# Patient Record
Sex: Male | Born: 1985
Health system: Southern US, Community
[De-identification: ages and names within clinical notes are randomized; demographics above are authoritative.]

## PROBLEM LIST (undated history)

## (undated) DIAGNOSIS — K644 Residual hemorrhoidal skin tags: Secondary | ICD-10-CM

## (undated) DIAGNOSIS — F17290 Nicotine dependence, other tobacco product, uncomplicated: Secondary | ICD-10-CM

## (undated) DIAGNOSIS — F419 Anxiety disorder, unspecified: Secondary | ICD-10-CM

## (undated) DIAGNOSIS — G4730 Sleep apnea, unspecified: Secondary | ICD-10-CM

## (undated) DIAGNOSIS — T4145XA Adverse effect of unspecified anesthetic, initial encounter: Secondary | ICD-10-CM

## (undated) DIAGNOSIS — K648 Other hemorrhoids: Secondary | ICD-10-CM

## (undated) DIAGNOSIS — T8859XA Other complications of anesthesia, initial encounter: Secondary | ICD-10-CM

## (undated) DIAGNOSIS — J301 Allergic rhinitis due to pollen: Secondary | ICD-10-CM

## (undated) HISTORY — DX: Allergic rhinitis due to pollen: J30.1

## (undated) HISTORY — DX: Other hemorrhoids: K64.8

## (undated) HISTORY — PX: WISDOM TOOTH EXTRACTION: SHX21

## (undated) HISTORY — DX: Sleep apnea, unspecified: G47.30

## (undated) HISTORY — DX: Anxiety disorder, unspecified: F41.9

## (undated) HISTORY — DX: Residual hemorrhoidal skin tags: K64.4

## (undated) HISTORY — DX: Nicotine dependence, other tobacco product, uncomplicated: F17.290

---
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
## 2011-12-14 ENCOUNTER — Ambulatory Visit: Payer: Self-pay | Admitting: Family Medicine

## 2013-07-18 ENCOUNTER — Telehealth: Payer: Self-pay | Admitting: Family Medicine

## 2013-07-18 NOTE — Telephone Encounter (Signed)
i am happy to see him, I know his wife and family

## 2013-07-18 NOTE — Telephone Encounter (Signed)
Pt scheduled for 07/23/2013

## 2013-07-18 NOTE — Telephone Encounter (Signed)
Pt is wanting to est w/you as a PCP and his wife says he is overall healthy other than some knee pain.  His wife, Tyler Simon is your patient.  I explained you are not currently accepting new patients and offered to est him w/another physician.  Mrs. Barajas asked if I could see if you could agree to see him since she is your patient. Would you be willing to accept him as a new patient? Thank you.

## 2013-07-23 ENCOUNTER — Encounter: Payer: Self-pay | Admitting: Family Medicine

## 2013-07-23 ENCOUNTER — Ambulatory Visit (INDEPENDENT_AMBULATORY_CARE_PROVIDER_SITE_OTHER): Payer: BC Managed Care – PPO | Admitting: Family Medicine

## 2013-07-23 VITALS — BP 100/66 | HR 50 | Temp 98.7°F | Ht 69.0 in | Wt 143.0 lb

## 2013-07-23 DIAGNOSIS — M25569 Pain in unspecified knee: Secondary | ICD-10-CM

## 2013-07-23 DIAGNOSIS — IMO0002 Reserved for concepts with insufficient information to code with codable children: Secondary | ICD-10-CM

## 2013-07-23 DIAGNOSIS — M222X1 Patellofemoral disorders, right knee: Secondary | ICD-10-CM

## 2013-07-23 DIAGNOSIS — F17200 Nicotine dependence, unspecified, uncomplicated: Secondary | ICD-10-CM

## 2013-07-23 DIAGNOSIS — F17290 Nicotine dependence, other tobacco product, uncomplicated: Secondary | ICD-10-CM

## 2013-07-23 DIAGNOSIS — L723 Sebaceous cyst: Secondary | ICD-10-CM

## 2013-07-23 DIAGNOSIS — R21 Rash and other nonspecific skin eruption: Secondary | ICD-10-CM

## 2013-07-23 DIAGNOSIS — J301 Allergic rhinitis due to pollen: Secondary | ICD-10-CM

## 2013-07-23 MED ORDER — TRIAMCINOLONE ACETONIDE 0.1 % EX CREA: 1.0000 "application " | TOPICAL_CREAM | Freq: Two times a day (BID) | CUTANEOUS | Status: DC

## 2013-07-23 NOTE — Patient Instructions (Addendum)
Alleve 2 tabs by mouth two times a day over the counter: Take at least for 2 - 3 weeks. This is equal to a prescripton strength dose (GENERIC CHEAPER EQUIVALENT IS NAPROXEN SODIUM)   Allegra or Zyrtec - once day

## 2013-07-23 NOTE — Progress Notes (Signed)
Date:  07/23/2013   Name:  Tyler Simon   DOB:  April 26, 1986   MRN:  [id number] Gender: male Age: 27 y.o.  Primary Physician:  Hannah Beat, MD   Chief Complaint: New Patient   Subjective:   History of Present Illness:  Tyler Simon is a 27 y.o. pleasant patient who presents with the following:  New patient: from [place].   Came over here to get married. 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 Works for [employer]  Allergies, some benadryl. Helping him a little bit.   Also, knee has ben bothering him a lot. Works on Public affairs consultant and back right. No history of prior injuury or problem. No buckling, no locking up.  r inguinal canal - lower, has something that is hard and palpable, tender and it causes him some pain intermittently and with activity.  He also has had a scattered rash  Patient Active Problem List   Diagnosis Date Noted  . Allergic rhinitis due to pollen 07/25/2013  . Pipe smoker 07/25/2013    Past Medical History  Diagnosis Date  . Allergic rhinitis due to pollen 07/25/2013  . Pipe smoker 07/25/2013    Past Surgical History  Procedure Laterality Date  . Appendectomy  1988    History   Social History  . Marital Status: Married    Spouse Name: Bo Merino    Number of Children: 0  . Years of Education: N/A   Occupational History  . Engineer     [employer]   Social History Main Topics  . Smoking status: Current Every Day Smoker    Types: Pipe  . Smokeless tobacco: Never Used  . Alcohol Use: Yes  . Drug Use: No  . Sexual Activity: Yes    Partners: Female   Other Topics Concern  . Not on file   Social History Narrative   Married to patient Production manager at [place]   From [place], [place]    No family history on file.  No Known Allergies  Medication list reviewed and updated in full in [facility].   Review of Systems:  GEN: No acute illnesses, no fevers, chills. GI: No n/v/d, eating normally Pulm: No SOB Interactive and  getting along well at home.  Otherwise, ROS is as per the HPI.   Objective:   Physical Examination: BP 100/66  Pulse 50  Temp(Src) 98.7 F (37.1 C) (Oral)  Ht 5\' 9"  (1.753 m)  Wt 143 lb (64.864 kg)  BMI 21.11 kg/m2  Ideal Body Weight: Weight in (lb) to have BMI = 25: 168.9   GEN: WDWN, NAD, Non-toxic, A & O x 3 HEENT: Atraumatic, Normocephalic. Neck supple. No masses, No LAD. Ears and Nose: No external deformity. CV: RRR, No M/G/R. No JVD. No thrill. No extra heart sounds. PULM: CTA B, no wheezes, crackles, rhonchi. No retractions. No resp. distress. No accessory muscle use. EXTR: No c/c/e NEURO Normal gait.  PSYCH: Normally interactive. Conversant. Not depressed or anxious appearing.  Calm demeanor.   GU: R inguinal region with palpable knot / moveable but painful in the inguinal ring but lower approaching genitals  Knee:  r Gait: Normal heel toe pattern ROM: 0-135 Effusion: neg Echymosis or edema: none Patellar tendon NT Painful PLICA: neg Patellar grind: negative Medial and lateral patellar facet loading: mildly tender medial and lateral joint lines:NT Mcmurray's neg Flexion-pinch neg Varus and valgus stress: stable Lachman: neg Ant and Post drawer: neg Hip abduction, IR, ER: WNL Hip flexion str: 5/5 Hip  abd: 5/5 Quad: 5/5 VMO atrophy:No Hamstring concentric and eccentric: 5/5   Assessment & Plan:   Rash and nonspecific skin eruption  Allergic rhinitis due to pollen  Pipe smoker  Patellofemoral pain syndrome, right  Inguinal cyst   Tac for rash Allergy meds  Reviewed basic knee care and rehab  Cyst / painful R inguinal canal, doubt that it is a hernia? Clearly painful and palpable and has grown. We will consult general surgery for their opinion in this case. Appears out of the scrotum.  Patient Instructions  Alleve 2 tabs by mouth two times a day over the counter: Take at least for 2 - 3 weeks. This is equal to a prescripton strength  dose (GENERIC CHEAPER EQUIVALENT IS NAPROXEN SODIUM)   Allegra or Zyrtec - once day   Orders Today:  No orders of the defined types were placed in this encounter.    New medications, updates to list, dose adjustments: Meds ordered this encounter  Medications  . diphenhydrAMINE (BENADRYL) 25 mg capsule    Sig: Take 25 mg by mouth at bedtime as needed.  . triamcinolone cream (KENALOG) 0.1 %    Sig: Apply 1 application topically 2 (two) times daily.    Dispense:  454 g    Refill:  1    Signed,  Monda Chastain T. Daren Yeagle, MD, CAQ Sports Medicine  [facility] at [facility] [address] [place] [address] Phone: [phone] Fax: [phone]  Updated Complete Medication List:   Medication List       This list is accurate as of: 07/23/13 11:59 PM.  Always use your most recent med list.               diphenhydrAMINE 25 mg capsule  Commonly known as:  BENADRYL  Take 25 mg by mouth at bedtime as needed.     triamcinolone cream 0.1 %  Commonly known as:  KENALOG  Apply 1 application topically 2 (two) times daily.

## 2013-07-23 NOTE — Progress Notes (Signed)
Pre-visit discussion using our clinic review tool. No additional management support is needed unless otherwise documented below in the visit note.  

## 2013-07-25 ENCOUNTER — Encounter: Payer: Self-pay | Admitting: Family Medicine

## 2013-07-25 DIAGNOSIS — F17290 Nicotine dependence, other tobacco product, uncomplicated: Secondary | ICD-10-CM

## 2013-07-25 DIAGNOSIS — J301 Allergic rhinitis due to pollen: Secondary | ICD-10-CM

## 2013-07-25 HISTORY — DX: Allergic rhinitis due to pollen: J30.1

## 2013-07-25 HISTORY — DX: Nicotine dependence, other tobacco product, uncomplicated: F17.290

## 2013-08-12 ENCOUNTER — Ambulatory Visit (INDEPENDENT_AMBULATORY_CARE_PROVIDER_SITE_OTHER): Payer: BC Managed Care – PPO | Admitting: Family Medicine

## 2013-08-12 ENCOUNTER — Encounter: Payer: Self-pay | Admitting: Family Medicine

## 2013-08-12 ENCOUNTER — Ambulatory Visit (INDEPENDENT_AMBULATORY_CARE_PROVIDER_SITE_OTHER)
Admission: RE | Admit: 2013-08-12 | Discharge: 2013-08-12 | Disposition: A | Discharge status: Home / Self Care | Payer: BC Managed Care – PPO | Source: Ambulatory Visit | Attending: Family Medicine | Admitting: Family Medicine

## 2013-08-12 VITALS — BP 110/60 | HR 52 | Temp 98.4°F | Ht 69.0 in | Wt 146.5 lb

## 2013-08-12 DIAGNOSIS — M25561 Pain in right knee: Secondary | ICD-10-CM

## 2013-08-12 DIAGNOSIS — M25569 Pain in unspecified knee: Secondary | ICD-10-CM

## 2013-08-12 NOTE — Progress Notes (Signed)
Date:  08/12/2013   Name:  Tyler Simon   DOB:  [birth year]   MRN:  [id number] Gender: male Age: 27 y.o.  Primary Physician:  Hannah Beat, MD   Chief Complaint: Knee Pain   Subjective:   History of Present Illness:  Stein Kurtz is a 27 y.o. pleasant patient who presents with the following:  Several months ago the patient was playing kickball, in a routine kickball league, and at some point during that, he developed some posterior knee pain. It has persisted since that time. It is actually been worsening in the last few weeks. I briefly look at his knee several weeks ago when he was in the office for something else. He has tried multiple conservative treatments including anti-inflammatory medications, ice, Tylenol, and some rehabilitation. This is been ongoing now for more than a month.  Right knee. No injury. Thought maybe part from kickball, but he stopped playing for a few weeks. Some pain with walking. Hurts a lot laterally and with deep flexion. He has not had any real mechanical locking up of his knee. He does feel as if it is in a hasn't symptomatic giving way. He does feel like he has had some weakness, particularly in the posterior aspect.  R posterior lateral pain, + mcmurrays, bounce home.   Patient Active Problem List   Diagnosis Date Noted  . Allergic rhinitis due to pollen 07/25/2013  . Pipe smoker 07/25/2013    Past Medical History  Diagnosis Date  . Allergic rhinitis due to pollen 07/25/2013  . Pipe smoker 07/25/2013    Past Surgical History  Procedure Laterality Date  . Appendectomy  1988    History   Social History  . Marital Status: Married    Spouse Name: Bo Merino    Number of Children: 0  . Years of Education: N/A   Occupational History  . Engineer     [employer]   Social History Main Topics  . Smoking status: Current Every Day Smoker    Types: Pipe  . Smokeless tobacco: Never Used  . Alcohol Use: Yes  . Drug Use: No  . Sexual Activity:  Yes    Partners: Female   Other Topics Concern  . Not on file   Social History Narrative   Married to patient Production manager at [place]   From [place], [place]    No family history on file.  No Known Allergies  Medication list has been reviewed and updated.  Review of Systems:  GEN: No fevers, chills. Nontoxic. Primarily MSK c/o today. MSK: Detailed in the HPI GI: tolerating PO intake without difficulty Neuro: No numbness, parasthesias, or tingling associated. Otherwise the pertinent positives of the ROS are noted above.   Objective:   Physical Examination: BP 110/60  Pulse 52  Temp(Src) 98.4 F (36.9 C) (Oral)  Ht 5\' 9"  (1.753 m)  Wt 146 lb 8 oz (66.452 kg)  BMI 21.62 kg/m2  Ideal Body Weight: Weight in (lb) to have BMI = 25: 168.9   GEN: WDWN, NAD, Non-toxic, Alert & Oriented x 3 HEENT: Atraumatic, Normocephalic.  Ears and Nose: No external deformity. EXTR: No clubbing/cyanosis/edema NEURO: Normal gait.  PSYCH: Normally interactive. Conversant. Not depressed or anxious appearing.  Calm demeanor.   Knee:  R Gait: Normal heel toe pattern ROM: 0-125 Effusion: neg Echymosis or edema: none Patellar tendon NT Painful PLICA: neg Patellar grind: negative Medial and lateral patellar facet loading: negative medial and lateral joint lines: ttp laterally and posterior  Mcmurray's + for pain Flexion-pinch + Bounce home + Varus and valgus stress: stable Lachman: neg Ant and Post drawer: neg Hip abduction, IR, ER: WNL Hip flexion str: 5/5 Hip abd: 5/5 Quad: 5/5 VMO atrophy:No Hamstring concentric and eccentric: 4/5 at 90 deg  Dg Knee Ap/lat W/sunrise Right  08/12/2013   CLINICAL DATA:  Pain  EXAM: DG KNEE - 3 VIEWS  COMPARISON:  None.  FINDINGS: There is no evidence of fracture, dislocation, or joint effusion. There is no evidence of arthropathy or other focal bone abnormality. Soft tissues are unremarkable.  IMPRESSION: Negative.   Electronically Signed    By: Oley Balm M.D.   On: 08/12/2013 11:11    Assessment & Plan:    Right knee pain - Plan: DG Knee AP/LAT W/Sunrise Right, MR Knee Right Wo Contrast  With a positive bounce home test, pain on the lateral joint line, and pain with McMurray's evaluation, this is suggestive of a lateral meniscal tear. The patient also has pain at the insertion of the biceps femoris tendon, and a posterior lateral corner injury cannot be excluded. Obtain an MRI of the RIGHT knee from to evaluate for potential meniscal tear or posterior lateral corner injury. This will dictate the patient's plan of care.   Patient Instructions  REFERRAL: GO THE THE FRONT ROOM AT THE ENTRANCE OF OUR CLINIC, NEAR CHECK IN. ASK FOR MARION. SHE WILL HELP YOU SET UP YOUR REFERRAL. DATE: TIME:    Orders Today:  Orders Placed This Encounter  Procedures  . DG Knee AP/LAT W/Sunrise Right  . MR Knee Right Wo Contrast    New medications, updates to list, dose adjustments: No orders of the defined types were placed in this encounter.    Signed,  Elpidio Galea. Maymie Brunke, MD, CAQ Sports Medicine  [facility] at [facility] [address] [place] [address] Phone: [phone] Fax: [phone]  Updated Complete Medication List:   Medication List    Notice As of 08/12/2013  7:56 PM   You have not been prescribed any medications.

## 2013-08-12 NOTE — Patient Instructions (Signed)
REFERRAL: GO THE THE FRONT ROOM AT THE ENTRANCE OF OUR CLINIC, NEAR CHECK IN. ASK FOR MARION. SHE WILL HELP YOU SET UP YOUR REFERRAL. DATE: TIME:  

## 2013-08-12 NOTE — Progress Notes (Signed)
Pre-visit discussion using our clinic review tool. No additional management support is needed unless otherwise documented below in the visit note.  

## 2013-08-19 ENCOUNTER — Other Ambulatory Visit: Payer: BC Managed Care – PPO

## 2013-08-25 ENCOUNTER — Ambulatory Visit (INDEPENDENT_AMBULATORY_CARE_PROVIDER_SITE_OTHER): Payer: BC Managed Care – PPO | Admitting: General Surgery

## 2013-08-29 ENCOUNTER — Encounter (INDEPENDENT_AMBULATORY_CARE_PROVIDER_SITE_OTHER): Payer: Self-pay | Admitting: Surgery

## 2013-08-29 ENCOUNTER — Encounter (INDEPENDENT_AMBULATORY_CARE_PROVIDER_SITE_OTHER): Payer: Self-pay

## 2013-08-29 ENCOUNTER — Ambulatory Visit (INDEPENDENT_AMBULATORY_CARE_PROVIDER_SITE_OTHER): Payer: BC Managed Care – PPO | Admitting: Surgery

## 2013-08-29 VITALS — BP 126/76 | HR 60 | Temp 99.0°F | Resp 18 | Ht 69.0 in | Wt 143.0 lb

## 2013-08-29 DIAGNOSIS — R590 Localized enlarged lymph nodes: Secondary | ICD-10-CM | POA: Insufficient documentation

## 2013-08-29 DIAGNOSIS — R599 Enlarged lymph nodes, unspecified: Secondary | ICD-10-CM

## 2013-08-29 NOTE — Progress Notes (Signed)
Patient ID: Tyler Simon, male   DOB: 1986-04-10, 27 y.o.   MRN: [id number]  Chief Complaint  Patient presents with  . New Evaluation    cyst  rt groin     HPI Tyler Simon is a 27 y.o. male.  Referred by Dr. 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 He has had ultrasounds of this area when he was still living in [place]. He feels like it may be slightly larger. He is disturbed by the fact that it persists. He comes in today for surgical evaluation for excisional biopsy.    He has had some issues with pain in his right hip as well as his right knee.  Past Medical History  Diagnosis Date  . Allergic rhinitis due to pollen 07/25/2013  . Pipe smoker 07/25/2013    Past Surgical History  Procedure Laterality Date  . Appendectomy  1988    Family History  Problem Relation Age of Onset  . Alzheimer's disease Father     Social History History  Substance Use Topics  . Smoking status: Current Every Day Smoker    Types: Pipe  . Smokeless tobacco: Never Used  . Alcohol Use: Yes  From [place], [place]  No Known Allergies  No current outpatient prescriptions on file.   No current facility-administered medications for this visit.    Review of Systems Review of Systems  Constitutional: Negative for fever, chills and unexpected weight change.  HENT: Negative for congestion, hearing loss, sore throat, trouble swallowing and voice change.   Eyes: Negative for visual disturbance.  Respiratory: Negative for cough and wheezing.   Cardiovascular: Negative for chest pain, palpitations and leg swelling.  Gastrointestinal: Negative for nausea, vomiting, abdominal pain, diarrhea, constipation, blood in stool, abdominal distention, anal bleeding and rectal pain.  Genitourinary: Negative for hematuria and difficulty urinating.  Musculoskeletal: Negative for arthralgias.   Skin: Negative for rash and wound.  Neurological: Negative for seizures, syncope, weakness and headaches.  Hematological: Negative for adenopathy. Does not bruise/bleed easily.  Psychiatric/Behavioral: Negative for confusion.    Blood pressure 126/76, pulse 60, temperature 99 F (37.2 C), resp. rate 18, height 5\' 9"  (1.753 m), weight 143 lb (64.864 kg).  Physical Exam Physical Exam WDWN in NAD HEENT:  EOMI, sclera anicteric Neck:  No masses, no thyromegaly Lungs:  CTA bilaterally; normal respiratory effort CV:  Regular rate and rhythm; no murmurs Abd:  +bowel sounds, soft, non-tender, no masses GU:  Right groin near pubic tubercle - 1.5 cm subcutaneous mass; not protruding; Ext:  Well-perfused; no edema Skin:  Warm, dry; no sign of jaundice  Data Reviewed none  Assessment    Right inguinal lymphadenopathy; this feels deeper than a typical sebaceous cyst.     Plan    Recommend excisional lymph node biopsy for diagnosis.  The surgical procedure has been discussed with the patient.  Potential risks, benefits, alternative treatments, and expected outcomes have been explained.  All of the patient's questions at this time have been answered.  The likelihood of reaching the patient's treatment goal is good.  The patient understand the proposed surgical procedure and wishes to proceed.  08/29/2013 11:57 AM        Tyffani Foglesong K. 08/29/2013, 11:49 AM

## 2013-09-26 ENCOUNTER — Encounter (HOSPITAL_COMMUNITY): Payer: Self-pay | Admitting: Pharmacy Technician

## 2013-10-02 ENCOUNTER — Encounter (HOSPITAL_COMMUNITY): Payer: Self-pay

## 2013-10-02 ENCOUNTER — Encounter (HOSPITAL_COMMUNITY)
Admission: RE | Admit: 2013-10-02 | Discharge: 2013-10-02 | Disposition: A | Discharge status: Home / Self Care | Payer: BC Managed Care – PPO | Source: Ambulatory Visit | Attending: Surgery | Admitting: Surgery

## 2013-10-02 DIAGNOSIS — Z01812 Encounter for preprocedural laboratory examination: Secondary | ICD-10-CM | POA: Insufficient documentation

## 2013-10-02 DIAGNOSIS — Z01818 Encounter for other preprocedural examination: Secondary | ICD-10-CM | POA: Insufficient documentation

## 2013-10-02 HISTORY — DX: Other complications of anesthesia, initial encounter: T88.59XA

## 2013-10-02 HISTORY — DX: Adverse effect of unspecified anesthetic, initial encounter: T41.45XA

## 2013-10-02 LAB — BASIC METABOLIC PANEL
BUN: 19 mg/dL (ref 6–23)
CO2: 29 meq/L (ref 19–32)
Calcium: 9.5 mg/dL (ref 8.4–10.5)
Chloride: 103 mEq/L (ref 96–112)
Creatinine, Ser: 1.03 mg/dL (ref 0.50–1.35)
GFR calc Af Amer: >90 mL/min (ref >90)
GFR calc non Af Amer: >90 mL/min (ref >90)
Glucose, Bld: 136 mg/dL — ABNORMAL HIGH (ref 70–99)
Potassium: 4.7 mEq/L (ref 3.7–5.3)
Sodium: 142 mEq/L (ref 137–147)

## 2013-10-02 LAB — CBC
HCT: 44.9 % (ref 39.0–52.0)
Hemoglobin: 15.7 g/dL (ref 13.0–17.0)
MCH: 31.2 pg (ref 26.0–34.0)
MCHC: 35 g/dL (ref 30.0–36.0)
MCV: 89.1 fL (ref 78.0–100.0)
PLATELETS: 197 10*3/uL (ref 150–400)
RBC: 5.04 MIL/uL (ref 4.22–5.81)
RDW: 12.8 % (ref 11.5–15.5)
WBC: 6.5 10*3/uL (ref 4.0–10.5)

## 2013-10-02 NOTE — Pre-Procedure Instructions (Signed)
Tyler Simon  10/02/2013   Your procedure is scheduled on:  Thursday October 09, 2013 at 0931 AM  Report to [facility] Short Stay Main Entrance "A" at [phone] AM.  Call this number if you have problems the morning of surgery: [phone]   Remember:   Do not eat food or drink liquids after midnight.   Take these medicines the morning of surgery with A SIP OF WATER: None Stop all vitamins, Herbal medications, and Nsaids (Ibuprofen, Advil, Aleve and Naproxen) 5 days prior to surgery.  Do not wear jewelry.  Do not wear lotions,or powders. You may wear deodorant.             Men may shave face and neck.  Do not bring valuables to the hospital.  [facility] is not responsible for any belongings or valuables.               Contacts, dentures or bridgework may not be worn into surgery.  Leave suitcase in the car. After surgery it may be brought to your room.  For patients admitted to the hospital, discharge time is determined by your treatment team.               Patients discharged the day of surgery will not be allowed to drive home.  Name and phone number of your driver:   Special Instructions: Shower using CHG 2 nights before surgery and the night before surgery.  If you shower the day of surgery use CHG.  Use special wash - you have one bottle of CHG for all showers.  You should use approximately 1/3 of the bottle for each shower.   Please read over the following fact sheets that you were given: Pain Booklet, Coughing and Deep Breathing and Surgical Site Infection Prevention

## 2013-10-08 MED ORDER — CHLORHEXIDINE GLUCONATE 4 % EX LIQD: 1.0000 "application " | Freq: Once | CUTANEOUS | Status: DC

## 2013-10-08 MED ORDER — CEFAZOLIN SODIUM-DEXTROSE 2-3 GM-% IV SOLR
2.0000 g | INTRAVENOUS | Status: AC
  Administered 2013-10-09: 2 g via INTRAVENOUS
  Filled 2013-10-08: qty 50

## 2013-10-09 ENCOUNTER — Ambulatory Visit (HOSPITAL_COMMUNITY)
Admission: RE | Admit: 2013-10-09 | Discharge: 2013-10-09 | Disposition: A | Discharge status: Home / Self Care | Payer: BC Managed Care – PPO | Source: Ambulatory Visit | Attending: Surgery | Admitting: Surgery

## 2013-10-09 ENCOUNTER — Encounter (HOSPITAL_COMMUNITY)
Admission: RE | Disposition: A | Discharge status: Home / Self Care | Payer: Self-pay | Source: Ambulatory Visit | Attending: Surgery

## 2013-10-09 ENCOUNTER — Ambulatory Visit (HOSPITAL_COMMUNITY): Payer: BC Managed Care – PPO | Admitting: Anesthesiology

## 2013-10-09 ENCOUNTER — Encounter (HOSPITAL_COMMUNITY): Payer: BC Managed Care – PPO | Admitting: Anesthesiology

## 2013-10-09 ENCOUNTER — Encounter (HOSPITAL_COMMUNITY): Payer: Self-pay | Admitting: *Deleted

## 2013-10-09 DIAGNOSIS — F17200 Nicotine dependence, unspecified, uncomplicated: Secondary | ICD-10-CM | POA: Insufficient documentation

## 2013-10-09 DIAGNOSIS — J301 Allergic rhinitis due to pollen: Secondary | ICD-10-CM | POA: Insufficient documentation

## 2013-10-09 DIAGNOSIS — R599 Enlarged lymph nodes, unspecified: Secondary | ICD-10-CM | POA: Insufficient documentation

## 2013-10-09 DIAGNOSIS — IMO0002 Reserved for concepts with insufficient information to code with codable children: Secondary | ICD-10-CM

## 2013-10-09 DIAGNOSIS — N508 Other specified disorders of male genital organs: Secondary | ICD-10-CM | POA: Insufficient documentation

## 2013-10-09 DIAGNOSIS — N5089 Other specified disorders of the male genital organs: Secondary | ICD-10-CM

## 2013-10-09 HISTORY — PX: INGUINAL HERNIA REPAIR: SHX194

## 2013-10-09 SURGERY — REPAIR, HERNIA, INGUINAL, ADULT
Anesthesia: General | Site: Groin | Laterality: Right

## 2013-10-09 MED ORDER — LIDOCAINE HCL (CARDIAC) 20 MG/ML IV SOLN
INTRAVENOUS | Status: AC
  Filled 2013-10-09: qty 5

## 2013-10-09 MED ORDER — MORPHINE SULFATE 2 MG/ML IJ SOLN: 2.0000 mg | INTRAMUSCULAR | Status: DC | PRN

## 2013-10-09 MED ORDER — MIDAZOLAM HCL 2 MG/2ML IJ SOLN
INTRAMUSCULAR | Status: AC
  Filled 2013-10-09: qty 2

## 2013-10-09 MED ORDER — ONDANSETRON HCL 4 MG/2ML IJ SOLN
INTRAMUSCULAR | Status: AC
  Filled 2013-10-09: qty 2

## 2013-10-09 MED ORDER — DEXAMETHASONE SODIUM PHOSPHATE 10 MG/ML IJ SOLN
INTRAMUSCULAR | Status: DC | PRN
  Administered 2013-10-09: 8 mg via INTRAVENOUS

## 2013-10-09 MED ORDER — ONDANSETRON HCL 4 MG/2ML IJ SOLN
INTRAMUSCULAR | Status: DC | PRN
  Administered 2013-10-09: 4 mg via INTRAVENOUS

## 2013-10-09 MED ORDER — LACTATED RINGERS IV SOLN
INTRAVENOUS | Status: DC | PRN
  Administered 2013-10-09: 09:00:00 via INTRAVENOUS

## 2013-10-09 MED ORDER — HYDROMORPHONE HCL PF 1 MG/ML IJ SOLN
0.2500 mg | INTRAMUSCULAR | Status: DC | PRN
  Administered 2013-10-09 (×2): 0.5 mg via INTRAVENOUS

## 2013-10-09 MED ORDER — HYDROCODONE-ACETAMINOPHEN 5-325 MG PO TABS: 1.0000 | ORAL_TABLET | ORAL | Status: DC | PRN

## 2013-10-09 MED ORDER — PROPOFOL 10 MG/ML IV BOLUS
INTRAVENOUS | Status: DC | PRN
  Administered 2013-10-09: 200 mg via INTRAVENOUS

## 2013-10-09 MED ORDER — HYDROMORPHONE HCL PF 1 MG/ML IJ SOLN
INTRAMUSCULAR | Status: AC
  Filled 2013-10-09: qty 1

## 2013-10-09 MED ORDER — LACTATED RINGERS IV SOLN
INTRAVENOUS | Status: DC
  Administered 2013-10-09: 08:00:00 via INTRAVENOUS

## 2013-10-09 MED ORDER — DEXAMETHASONE SODIUM PHOSPHATE 4 MG/ML IJ SOLN
INTRAMUSCULAR | Status: AC
  Filled 2013-10-09: qty 2

## 2013-10-09 MED ORDER — LIDOCAINE HCL (CARDIAC) 20 MG/ML IV SOLN
INTRAVENOUS | Status: DC | PRN
  Administered 2013-10-09: 80 mg via INTRAVENOUS

## 2013-10-09 MED ORDER — MIDAZOLAM HCL 5 MG/5ML IJ SOLN
INTRAMUSCULAR | Status: DC | PRN
  Administered 2013-10-09: 2 mg via INTRAVENOUS

## 2013-10-09 MED ORDER — BUPIVACAINE-EPINEPHRINE 0.25% -1:200000 IJ SOLN
INTRAMUSCULAR | Status: DC | PRN
  Administered 2013-10-09: 5 mL

## 2013-10-09 MED ORDER — ONDANSETRON HCL 4 MG/2ML IJ SOLN: 4.0000 mg | INTRAMUSCULAR | Status: DC | PRN

## 2013-10-09 MED ORDER — FENTANYL CITRATE 0.05 MG/ML IJ SOLN
INTRAMUSCULAR | Status: AC
  Filled 2013-10-09: qty 5

## 2013-10-09 MED ORDER — ONDANSETRON HCL 4 MG/2ML IJ SOLN: 4.0000 mg | Freq: Once | INTRAMUSCULAR | Status: DC | PRN

## 2013-10-09 MED ORDER — FENTANYL CITRATE 0.05 MG/ML IJ SOLN
INTRAMUSCULAR | Status: DC | PRN
  Administered 2013-10-09: 50 ug via INTRAVENOUS
  Administered 2013-10-09: 25 ug via INTRAVENOUS
  Administered 2013-10-09: 100 ug via INTRAVENOUS

## 2013-10-09 MED ORDER — PROPOFOL 10 MG/ML IV BOLUS
INTRAVENOUS | Status: AC
  Filled 2013-10-09: qty 20

## 2013-10-09 SURGICAL SUPPLY — 49 items
BENZOIN TINCTURE PRP APPL 2/3 (GAUZE/BANDAGES/DRESSINGS) ×2 IMPLANT
BLADE SURG 15 STRL LF DISP TIS (BLADE) ×1 IMPLANT
BLADE SURG 15 STRL SS (BLADE) ×1
BLADE SURG ROTATE 9660 (MISCELLANEOUS) IMPLANT
CHLORAPREP W/TINT 26ML (MISCELLANEOUS) ×2 IMPLANT
COVER SURGICAL LIGHT HANDLE (MISCELLANEOUS) ×2 IMPLANT
DECANTER SPIKE VIAL GLASS SM (MISCELLANEOUS) IMPLANT
DRAIN PENROSE 1/2X12 LTX STRL (WOUND CARE) IMPLANT
DRAPE LAPAROSCOPIC ABDOMINAL (DRAPES) IMPLANT
DRAPE LAPAROTOMY TRNSV 102X78 (DRAPE) ×2 IMPLANT
DRAPE UTILITY 15X26 W/TAPE STR (DRAPE) ×4 IMPLANT
DRSG TEGADERM 2-3/8X2-3/4 SM (GAUZE/BANDAGES/DRESSINGS) ×2 IMPLANT
DRSG TEGADERM 4X4.75 (GAUZE/BANDAGES/DRESSINGS) IMPLANT
ELECT CAUTERY BLADE 6.4 (BLADE) ×2 IMPLANT
ELECT REM PT RETURN 9FT ADLT (ELECTROSURGICAL) ×2
ELECTRODE REM PT RTRN 9FT ADLT (ELECTROSURGICAL) ×1 IMPLANT
GAUZE SPONGE 2X2 8PLY STRL LF (GAUZE/BANDAGES/DRESSINGS) ×1 IMPLANT
GAUZE SPONGE 4X4 16PLY XRAY LF (GAUZE/BANDAGES/DRESSINGS) IMPLANT
GLOVE BIO SURGEON STRL SZ7 (GLOVE) ×2 IMPLANT
GLOVE BIOGEL PI IND STRL 7.5 (GLOVE) ×1 IMPLANT
GLOVE BIOGEL PI INDICATOR 7.5 (GLOVE) ×1
GLOVE SURG SIGNA 7.5 PF LTX (GLOVE) ×2 IMPLANT
GOWN PREVENTION PLUS XLARGE (GOWN DISPOSABLE) ×2 IMPLANT
GOWN STRL NON-REIN LRG LVL3 (GOWN DISPOSABLE) ×4 IMPLANT
KIT BASIN OR (CUSTOM PROCEDURE TRAY) ×2 IMPLANT
KIT ROOM TURNOVER OR (KITS) ×2 IMPLANT
NEEDLE HYPO 25GX1X1/2 BEV (NEEDLE) ×2 IMPLANT
NS IRRIG 1000ML POUR BTL (IV SOLUTION) ×2 IMPLANT
PACK SURGICAL SETUP 50X90 (CUSTOM PROCEDURE TRAY) ×2 IMPLANT
PAD ARMBOARD 7.5X6 YLW CONV (MISCELLANEOUS) ×2 IMPLANT
PENCIL BUTTON HOLSTER BLD 10FT (ELECTRODE) ×2 IMPLANT
SPECIMEN JAR SMALL (MISCELLANEOUS) IMPLANT
SPONGE GAUZE 2X2 STER 10/PKG (GAUZE/BANDAGES/DRESSINGS) ×1
SPONGE GAUZE 4X4 12PLY (GAUZE/BANDAGES/DRESSINGS) ×2 IMPLANT
SPONGE INTESTINAL PEANUT (DISPOSABLE) IMPLANT
STRIP CLOSURE SKIN 1/2X4 (GAUZE/BANDAGES/DRESSINGS) ×2 IMPLANT
SUT MNCRL AB 4-0 PS2 18 (SUTURE) ×2 IMPLANT
SUT PDS AB 0 CT 36 (SUTURE) IMPLANT
SUT SILK 2 0 SH (SUTURE) IMPLANT
SUT SILK 3 0 (SUTURE)
SUT SILK 3-0 18XBRD TIE 12 (SUTURE) IMPLANT
SUT VIC AB 0 CT2 27 (SUTURE) ×2 IMPLANT
SUT VIC AB 2-0 SH 27 (SUTURE) ×1
SUT VIC AB 2-0 SH 27X BRD (SUTURE) ×1 IMPLANT
SUT VIC AB 3-0 SH 27 (SUTURE) ×1
SUT VIC AB 3-0 SH 27XBRD (SUTURE) ×1 IMPLANT
SYR CONTROL 10ML LL (SYRINGE) ×2 IMPLANT
TOWEL OR 17X24 6PK STRL BLUE (TOWEL DISPOSABLE) IMPLANT
TOWEL OR 17X26 10 PK STRL BLUE (TOWEL DISPOSABLE) ×2 IMPLANT

## 2013-10-09 NOTE — H&P (Signed)
  Patient ID: Tyler Simon, male DOB: 10/04/1985, 28 y.o. MRN: [id number]  Chief Complaint   Patient presents with   .  New Evaluation     cyst rt groin   HPI  Tyler Simon is a 28 y.o. male. Referred by Dr. 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 He has had ultrasounds of this area when he was still living in [place]. He feels like it may be slightly larger. He is disturbed by the fact that it persists. He comes in today for surgical evaluation for excisional biopsy.  He has had some issues with pain in his right hip as well as his right knee.  Past Medical History   Diagnosis  Date   .  Allergic rhinitis due to pollen  07/25/2013   .  Pipe smoker  07/25/2013    Past Surgical History   Procedure  Laterality  Date   .  Appendectomy   1988    Family History   Problem  Relation  Age of Onset   .  Alzheimer's disease  Father    Social History  History   Substance Use Topics   .  Smoking status:  Current Every Day Smoker     Types:  Pipe   .  Smokeless tobacco:  Never Used   .  Alcohol Use:  Yes   From [place], [place]  No Known Allergies  No current outpatient prescriptions on file.    No current facility-administered medications for this visit.   Review of Systems  Review of Systems  Constitutional: Negative for fever, chills and unexpected weight change.  HENT: Negative for congestion, hearing loss, sore throat, trouble swallowing and voice change.  Eyes: Negative for visual disturbance.  Respiratory: Negative for cough and wheezing.  Cardiovascular: Negative for chest pain, palpitations and leg swelling.  Gastrointestinal: Negative for nausea, vomiting, abdominal pain, diarrhea, constipation, blood in stool, abdominal distention, anal bleeding and rectal pain.  Genitourinary: Negative for hematuria and difficulty urinating.  Musculoskeletal:  Negative for arthralgias.  Skin: Negative for rash and wound.  Neurological: Negative for seizures, syncope, weakness and headaches.  Hematological: Negative for adenopathy. Does not bruise/bleed easily.  Psychiatric/Behavioral: Negative for confusion.  Blood pressure 126/76, pulse 60, temperature 99 F (37.2 C), resp. rate 18, height 5\' 9"  (1.753 m), weight 143 lb (64.864 kg).  Physical Exam  Physical Exam  WDWN in NAD  HEENT: EOMI, sclera anicteric  Neck: No masses, no thyromegaly  Lungs: CTA bilaterally; normal respiratory effort  CV: Regular rate and rhythm; no murmurs  Abd: +bowel sounds, soft, non-tender, no masses  GU: Right groin near pubic tubercle - 1.5 cm subcutaneous mass; not protruding;  Ext: Well-perfused; no edema  Skin: Warm, dry; no sign of jaundice  Data Reviewed  none  Assessment  Right inguinal lymphadenopathy; this feels deeper than a typical sebaceous cyst.  Plan  Recommend excisional lymph node biopsy for diagnosis. The surgical procedure has been discussed with the patient. Potential risks, benefits, alternative treatments, and expected outcomes have been explained. All of the patient's questions at this time have been answered. The likelihood of reaching the patient's treatment goal is good. The patient understand the proposed surgical procedure and wishes to proceed.  Wilmon ArmsMatthew K. Corliss Skainssuei, MD, [facility]  General/ Trauma Surgery  10/09/2013 9:00 AM

## 2013-10-09 NOTE — Preoperative (Signed)
Beta Blockers   Reason not to administer Beta Blockers:Not Applicable 

## 2013-10-09 NOTE — Anesthesia Preprocedure Evaluation (Signed)
Anesthesia Evaluation  Patient identified by MRN, date of birth, ID band Patient awake    Reviewed: Allergy & Precautions, H&P , NPO status , Patient's Chart, lab work & pertinent test results  Airway       Dental   Pulmonary Current Smoker,          Cardiovascular     Neuro/Psych    GI/Hepatic   Endo/Other    Renal/GU      Musculoskeletal   Abdominal   Peds  Hematology   Anesthesia Other Findings   Reproductive/Obstetrics                           Anesthesia Physical Anesthesia Plan  ASA: I  Anesthesia Plan: General   Post-op Pain Management:    Induction: Intravenous  Airway Management Planned: LMA and Mask  Additional Equipment:   Intra-op Plan:   Post-operative Plan: Extubation in OR  Informed Consent: I have reviewed the patients History and Physical, chart, labs and discussed the procedure including the risks, benefits and alternatives for the proposed anesthesia with the patient or authorized representative who has indicated his/her understanding and acceptance.     Plan Discussed with:   Anesthesia Plan Comments:         Anesthesia Quick Evaluation

## 2013-10-09 NOTE — Transfer of Care (Signed)
Immediate Anesthesia Transfer of Care Note  Patient: Tyler Simon  Procedure(s) Performed: Procedure(s): Right Groin Exploration (Right)  Patient Location: PACU  Anesthesia Type:General  Level of Consciousness: awake, alert  and oriented  Airway & Oxygen Therapy: Patient Spontanous Breathing  Post-op Assessment: Report given to PACU RN and Post -op Vital signs reviewed and stable  Post vital signs: Reviewed and stable  Complications: No apparent anesthesia complications

## 2013-10-09 NOTE — Discharge Instructions (Signed)
You may shower tomorrow over the clear dressing.  On Saturday, you may remove the dressing and gauze.  You will see a few small steri-strips.  You may shower over the steri-strips.  They will come off on their own in a week.    You may apply ice to this area today and tomorrow, if needed.  Take the pain medicine as needed.

## 2013-10-09 NOTE — Anesthesia Postprocedure Evaluation (Signed)
  Anesthesia Post-op Note  Patient: Tyler Simon  Procedure(s) Performed: Procedure(s): Right Groin Exploration (Right)  Patient Location: PACU  Anesthesia Type:General  Level of Consciousness: awake, alert , oriented and patient cooperative  Airway and Oxygen Therapy: Patient Spontanous Breathing  Post-op Pain: mild  Post-op Assessment: Post-op Vital signs reviewed, Patient's Cardiovascular Status Stable, Respiratory Function Stable, Patent Airway, No signs of Nausea or vomiting and Pain level controlled  Post-op Vital Signs: stable  Complications: No apparent anesthesia complications

## 2013-10-09 NOTE — Op Note (Signed)
Preop diagnosis: Right groin mass Postop diagnosis: Enlarged right vas deferens Procedure performed: Right groin exploration Surgeon:Doy Taaffe K. Anesthesia: Gen. Via LMA Indications: This is a 28 year old male who presents with a 5 year history of a half mass in his right groin. This has enlarged slightly. He has no systemic symptoms.  This area is easily palpable as the patient is quite thin. He comes in today for excisional biopsy this area.  Description of procedure: The patient brought to the operating room and placed in supine position on the operating room table. After an adequate level of general anesthesia was obtained, his right groin was shaved prepped with chlor prep and draped in sterile fashion. A timeout was taken to ensure the proper patient proper procedure. We infiltrated the area over the palpable mass with 0.25% Marcaine with epinephrine. A 2 cm incision was made over the mass. Dissection was carried down through the subcutaneous tissues with cautery. We bluntly dissected down to the palpable mass. This seems to be within the spermatic cord as it exits the external inguinal ring. We were able to gently mobilize the spermatic cord up into the field. I carefully began skeletonizing the spermatic cord. We exposed the palpable mass. The mass appears whitish in color and is long and tubular. On further exploration it becomes very obvious that this is an enlarged vas deferens. There is no end to the mass proximally or distally.  This just seems to be a fusiform thickening of a short segment of the vas deferens for a distance of about 2-1/2 cm. I made the decision to leave this in place as we were now para to perform a vasectomy today. The patient will be referred to urology.  We closed the wound in several layers with 201 3-0 Vicryl. The skin was closed with 4-0 Monocryl. Steri-Strips and an occlusive dressing were applied. The patient was then extubated and brought to recovery room in  stable condition. All sponge, instrument, and needle counts are correct.  Wilmon ArmsMatthew K. Corliss Skainssuei, MD, [facility]  General/ Trauma Surgery  10/09/2013 10:31 AM

## 2013-10-13 ENCOUNTER — Encounter (HOSPITAL_COMMUNITY): Payer: Self-pay | Admitting: Surgery

## 2013-10-17 ENCOUNTER — Other Ambulatory Visit (INDEPENDENT_AMBULATORY_CARE_PROVIDER_SITE_OTHER): Payer: Self-pay | Admitting: Surgery

## 2013-10-17 ENCOUNTER — Encounter (INDEPENDENT_AMBULATORY_CARE_PROVIDER_SITE_OTHER): Payer: Self-pay | Admitting: Surgery

## 2013-10-17 ENCOUNTER — Ambulatory Visit (INDEPENDENT_AMBULATORY_CARE_PROVIDER_SITE_OTHER): Payer: BC Managed Care – PPO | Admitting: Surgery

## 2013-10-17 VITALS — BP 113/60 | HR 60 | Temp 98.9°F | Resp 14 | Ht 69.0 in | Wt 143.8 lb

## 2013-10-17 DIAGNOSIS — R1909 Other intra-abdominal and pelvic swelling, mass and lump: Secondary | ICD-10-CM

## 2013-10-17 DIAGNOSIS — N5089 Other specified disorders of the male genital organs: Secondary | ICD-10-CM

## 2013-10-17 NOTE — Progress Notes (Signed)
The patient underwent right groin exploration on 10/09/13. The palpable mass in his right groin turned out to be a thickening of his vas deferens. There was a 2-1/2 cm fusiform thickening of the vas deferens as it exited the external inguinal ring. I chose not to biopsy or remove this.  The patient's incision is healing well. He has minimal swelling. No bruising in this area. No sign of infection. He still some tenderness at his external ring.  We will refer him to urology for evaluation of the thickened vas deferens. We will see him back as needed.  Wilmon ArmsMatthew K. Corliss Skainssuei, MD, [facility]  General/ Trauma Surgery  10/17/2013 9:52 AM

## 2013-10-22 ENCOUNTER — Other Ambulatory Visit (HOSPITAL_COMMUNITY): Payer: Self-pay | Admitting: Urology

## 2013-10-22 DIAGNOSIS — N509 Disorder of male genital organs, unspecified: Secondary | ICD-10-CM

## 2013-10-23 ENCOUNTER — Ambulatory Visit (HOSPITAL_COMMUNITY)
Admission: RE | Admit: 2013-10-23 | Discharge: 2013-10-23 | Disposition: A | Discharge status: Home / Self Care | Payer: BC Managed Care – PPO | Source: Ambulatory Visit | Attending: Urology | Admitting: Urology

## 2013-10-23 DIAGNOSIS — N509 Disorder of male genital organs, unspecified: Secondary | ICD-10-CM

## 2013-10-23 DIAGNOSIS — I862 Pelvic varices: Secondary | ICD-10-CM | POA: Insufficient documentation

## 2013-10-23 DIAGNOSIS — I861 Scrotal varices: Secondary | ICD-10-CM | POA: Insufficient documentation

## 2013-10-23 DIAGNOSIS — R109 Unspecified abdominal pain: Secondary | ICD-10-CM | POA: Insufficient documentation

## 2013-10-23 MED ORDER — GADOBENATE DIMEGLUMINE 529 MG/ML IV SOLN
13.0000 mL | Freq: Once | INTRAVENOUS | Status: AC | PRN
  Administered 2013-10-23: 13 mL via INTRAVENOUS

## 2014-12-21 ENCOUNTER — Telehealth: Payer: Self-pay | Admitting: Family Medicine

## 2014-12-21 NOTE — Telephone Encounter (Signed)
Patient Name: Tyler Simon DOB: 08/01/1986 Initial Comment Caller states he hurt back yesterday, standing very painful, wants to know what he can take. Nurse Assessment Nurse: Elijah Birkaldwell, RN, Lynda Date/Time (Eastern Time): 12/21/2014 9:38:57 AM Confirm and document reason for call. If symptomatic, describe symptoms. ---Caller states he hurt his lower back yesterday, doing house work & using balance ball, standing is very painful, wants to know what he can take. Took a European Ibuprofen like medication. Has the patient traveled out of the country within the last 30 days? ---Not Applicable Does the patient require triage? ---Yes Related visit to physician within the last 2 weeks? ---No Does the PT have any chronic conditions? (i.e. diabetes, asthma, etc.) ---No Guidelines Guideline Title Affirmed Question Affirmed Notes Back Injury Back pain or stiffness from bending or twisting injury (all triage questions negative) Final Disposition User [facility] Care [place], RN, Stark BrayLynda

## 2015-04-01 ENCOUNTER — Telehealth: Payer: Self-pay | Admitting: Family Medicine

## 2015-04-01 NOTE — Telephone Encounter (Signed)
Note sent for spouse would include his info

## 2015-04-01 NOTE — Telephone Encounter (Signed)
Pt is travel out of country  spouse called [facility] They told her he needs to be update on vaccine  (normal) And needs rx for malaria [facility]

## 2015-04-02 MED ORDER — MEFLOQUINE HCL 250 MG PO TABS: 250.0000 mg | ORAL_TABLET | ORAL | Status: DC

## 2015-04-02 NOTE — Telephone Encounter (Signed)
Please send in: Mefloquine 250 mg. 1 tab po weekly. Begin 3 weeks prior to trip, continue during trip, and for 4 weeks after return home. Disp: 10, 0 ref

## 2015-04-02 NOTE — Telephone Encounter (Signed)
Tyler MerinoGeri (wife)  notified prescription has been sent to their pharmacy.

## 2015-06-23 ENCOUNTER — Telehealth: Payer: Self-pay | Admitting: *Deleted

## 2015-06-23 MED ORDER — ALBENDAZOLE 200 MG PO TABS
400.0000 mg | ORAL_TABLET | Freq: Every day | ORAL | Status: DC
Start: 2015-06-23 — End: 2016-01-05

## 2015-06-23 NOTE — Telephone Encounter (Signed)
Tyler Simon is inquiring if Tyler Simon should also be treating as well.  Please advise.

## 2015-06-23 NOTE — Telephone Encounter (Signed)
Geri notified prescription to treat Tyler Simon has been sent to his pharmacy.

## 2015-06-23 NOTE — Telephone Encounter (Signed)
Yes  Albendazole 200 mg, 2 tabs po x 3 days, #6

## 2015-07-01 ENCOUNTER — Encounter: Payer: Self-pay | Admitting: Family Medicine

## 2015-07-01 ENCOUNTER — Ambulatory Visit (INDEPENDENT_AMBULATORY_CARE_PROVIDER_SITE_OTHER)
Admission: RE | Admit: 2015-07-01 | Discharge: 2015-07-01 | Disposition: A | Discharge status: Home / Self Care | Payer: 59 | Source: Ambulatory Visit | Attending: Family Medicine | Admitting: Family Medicine

## 2015-07-01 ENCOUNTER — Ambulatory Visit (INDEPENDENT_AMBULATORY_CARE_PROVIDER_SITE_OTHER): Payer: 59 | Admitting: Family Medicine

## 2015-07-01 VITALS — BP 90/60 | HR 59 | Temp 98.5°F | Ht 69.0 in | Wt 151.2 lb

## 2015-07-01 DIAGNOSIS — M79644 Pain in right finger(s): Secondary | ICD-10-CM | POA: Diagnosis not present

## 2015-07-01 NOTE — Progress Notes (Signed)
Dr. Karleen HampshireSpencer T. Sheehan Stacey, MD, [facility] Sports Medicine Primary Care and Sports Medicine [address] [place], [address] Phone: [phone] Fax: [phone]  07/01/2015  Patient: Tyler JuniorMatthieu Rathke, MRN: [id number], DOB: 09/03/1986, 29 y.o.  Primary Physician:  Hannah BeatSpencer Natividad Halls, MD  Chief Complaint: Finger Injury  Subjective:   Val Rejeana Brocketit is a 29 y.o. very pleasant male patient who presents with the following:  Bent his R finger when playing a kickball. 1 month.  Middle at PIP Still some pain and mild swelling, not limited too much  Past Medical History, Surgical History, Social History, Family History, Problem List, Medications, and Allergies have been reviewed and updated if relevant.  Patient Active Problem List   Diagnosis Date Noted  . Vas deferens stricture 10/17/2013  . Inguinal lymphadenopathy 08/29/2013  . Allergic rhinitis due to pollen 07/25/2013  . Pipe smoker 07/25/2013    Past Medical History  Diagnosis Date  . Allergic rhinitis due to pollen 07/25/2013  . Pipe smoker 07/25/2013  . Complication of anesthesia     woke up during surgery with wisdom tooth    Past Surgical History  Procedure Laterality Date  . Appendectomy  1988  . Wisdom tooth extraction    . Inguinal hernia repair Right 10/09/2013    Procedure: Right Groin Exploration;  Surgeon: Wilmon ArmsMatthew K. Corliss Skainssuei, MD;  Location: [facility] OR;  Service: General;  Laterality: Right;    Social History   Social History  . Marital Status: Married    Spouse Name: Bo MerinoGeri  . Number of Children: 0  . Years of Education: N/A   Occupational History  . Engineer     [employer]   Social History Main Topics  . Smoking status: Current Some Day Smoker    Types: Pipe, Cigarettes  . Smokeless tobacco: Never Used  . Alcohol Use: 0.0 oz/week    0 Standard drinks or equivalent per week     Comment: social  . Drug Use: No  . Sexual Activity:    Partners: Female   Other Topics Concern  . 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 Alzheimer's disease Father     No Known Allergies  Medication list reviewed and updated in full in [place] Link.   GEN: No acute illnesses, no fevers, chills. GI: No n/v/d, eating normally Pulm: No SOB Interactive and getting along well at home.  Otherwise, ROS is as per the HPI.  Objective:   BP 90/60 mmHg  Pulse 59  Temp(Src) 98.5 F (36.9 C) (Oral)  Ht 5\' 9"  (1.753 m)  Wt 151 lb 4 oz (68.607 kg)  BMI 22.33 kg/m2  GEN: WDWN, NAD, Non-toxic, A & O x 3 HEENT: Atraumatic, Normocephalic. Neck supple. No masses, No LAD. Ears and Nose: No external deformity. CV: RRR, No M/G/R. No JVD. No thrill. No extra heart sounds. PULM: CTA B, no wheezes, crackles, rhonchi. No retractions. No resp. distress. No accessory muscle use. EXTR: No c/c/e NEURO Normal gait.  PSYCH: Normally interactive. Conversant. Not depressed or anxious appearing.  Calm demeanor.   R 2nd pain mild with varus and valgus stress, mild pain at compression of the PIP joint O/w all bony anatomy normal  Laboratory and Imaging Data: Dg Finger Middle Right  07/01/2015  CLINICAL DATA:  Right middle finger trauma 1 month ago. Rule out  occult fracture. EXAM: RIGHT MIDDLE FINGER 2+V COMPARISON:  None. FINDINGS: No fracture deformity or signs of fracture healing. Located and normally spaced joints. No acute soft tissue findings. IMPRESSION: Negative right middle finger. Electronically Signed   By: Marnee Spring M.D.   On: 07/01/2015 08:42     Assessment and Plan:   Pain of right middle finger - Plan: DG Finger Middle Right  PIP ligament sprain, healing  Follow-up: prn  New Prescriptions   No medications on file   Modified Medications   No medications on file   Orders Placed This Encounter  Procedures  . DG Finger Middle Right    Signed,  Karleen Hampshire T. Zikeria Keough,  MD   Patient's Medications  New Prescriptions   No medications on file  Previous Medications   ALBENDAZOLE (ALBENZA) 200 MG TABLET    Take 2 tablets (400 mg total) by mouth daily.  Modified Medications   No medications on file  Discontinued Medications   HYDROCODONE-ACETAMINOPHEN (NORCO/VICODIN) 5-325 MG PER TABLET    Take 1 tablet by mouth every 4 (four) hours as needed.   MEFLOQUINE (LARIAM) 250 MG TABLET    Take 1 tablet (250 mg total) by mouth every 7 (seven) days.

## 2015-07-01 NOTE — Progress Notes (Signed)
Pre visit review using our clinic review tool, if applicable. No additional management support is needed unless otherwise documented below in the visit note. 

## 2016-01-05 ENCOUNTER — Ambulatory Visit (INDEPENDENT_AMBULATORY_CARE_PROVIDER_SITE_OTHER)
Admission: RE | Admit: 2016-01-05 | Discharge: 2016-01-05 | Disposition: A | Discharge status: Home / Self Care | Payer: BLUE CROSS/BLUE SHIELD | Source: Ambulatory Visit | Attending: Family Medicine | Admitting: Family Medicine

## 2016-01-05 ENCOUNTER — Encounter: Payer: Self-pay | Admitting: Family Medicine

## 2016-01-05 ENCOUNTER — Ambulatory Visit (INDEPENDENT_AMBULATORY_CARE_PROVIDER_SITE_OTHER): Payer: BLUE CROSS/BLUE SHIELD | Admitting: Family Medicine

## 2016-01-05 VITALS — BP 90/54 | HR 47 | Temp 98.4°F | Ht 69.0 in | Wt 150.5 lb

## 2016-01-05 DIAGNOSIS — F321 Major depressive disorder, single episode, moderate: Secondary | ICD-10-CM | POA: Diagnosis not present

## 2016-01-05 DIAGNOSIS — M79604 Pain in right leg: Secondary | ICD-10-CM | POA: Diagnosis not present

## 2016-01-05 DIAGNOSIS — F411 Generalized anxiety disorder: Secondary | ICD-10-CM | POA: Diagnosis not present

## 2016-01-05 DIAGNOSIS — B351 Tinea unguium: Secondary | ICD-10-CM | POA: Diagnosis not present

## 2016-01-05 DIAGNOSIS — M79671 Pain in right foot: Secondary | ICD-10-CM | POA: Diagnosis not present

## 2016-01-05 DIAGNOSIS — S99921A Unspecified injury of right foot, initial encounter: Secondary | ICD-10-CM | POA: Diagnosis not present

## 2016-01-05 MED ORDER — CICLOPIROX 8 % EX SOLN: Freq: Every day | CUTANEOUS | Status: DC

## 2016-01-05 MED ORDER — FLUOXETINE HCL 20 MG PO CAPS: 20.0000 mg | ORAL_CAPSULE | Freq: Every day | ORAL | Status: DC

## 2016-01-05 NOTE — Progress Notes (Signed)
Pre visit review using our clinic review tool, if applicable. No additional management support is needed unless otherwise documented below in the visit note. 

## 2016-01-05 NOTE — Patient Instructions (Signed)

## 2016-01-05 NOTE — Progress Notes (Signed)
Dr. Karleen HampshireSpencer T. Deontaye Civello, MD, [facility] Sports Medicine Primary Care and Sports Medicine [address] [place], [address] Phone: [phone] Fax: [phone]  01/05/2016  Patient: Tyler Simon, MRN: [id number], DOB: 05/18/1986, 30 y.o.  Primary Physician:  Hannah BeatSpencer Deniro Laymon, MD   Chief Complaint  Patient presents with  . Foot Pain    Right-Hurt Playing Kick Ball   Subjective:   Tyler Simon is a 30 y.o. very pleasant male patient who presents with the following:  R foot: the patient was playing kickball this week, and when he kicked a ball in a way he started to have some pain on the dorsum of his foot.  This is continued and he is been limping and having some difficulty walking over the last few days.  Great toe R fungus. And some on the Left. tthis is been an intermittent ongoing problem, and he wants to see if he can try something to cleared up.  GAD Depression His father died 6 months ago, and he has been having some difficulty with his passing.  He is originally from [place], and all of his family is still in [place].  He has been more anxious over the last 6 months, or probably even more than this.  He has some occasional anxiety attacks.  He also feels somewhat down and depressed, and does not feel somewhat like himself.  He denies any suicidality or homicidality.  He does have good support structure in a supporting wife.  His family and other native [place] speakers however, still reside in [place].  Past Medical History, Surgical History, Social History, Family History, Problem List, Medications, and Allergies have been reviewed and updated if relevant.  Patient Active Problem List   Diagnosis Date Noted  . Vas deferens stricture 10/17/2013  . Inguinal lymphadenopathy 08/29/2013  . Allergic rhinitis due to pollen 07/25/2013  . Pipe smoker 07/25/2013    Past Medical History  Diagnosis Date  . Allergic rhinitis due to pollen 07/25/2013  . Pipe smoker 07/25/2013  .  Complication of anesthesia     woke up during surgery with wisdom tooth    Past Surgical History  Procedure Laterality Date  . Appendectomy  1988  . Wisdom tooth extraction    . Inguinal hernia repair Right 10/09/2013    Procedure: Right Groin Exploration;  Surgeon: Wilmon ArmsMatthew K. Corliss Skainssuei, MD;  Location: [facility] OR;  Service: General;  Laterality: Right;    Social History   Social History  . Marital Status: Married    Spouse Name: Bo MerinoGeri  . Number of Children: 0  . Years of Education: N/A   Occupational History  . Engineer     [employer]   Social History Main Topics  . Smoking status: Current Some Day Smoker    Types: Pipe, Cigarettes  . Smokeless tobacco: Never Used  . Alcohol Use: 0.0 oz/week    0 Standard drinks or equivalent per week     Comment: social  . Drug Use: No  . Sexual Activity:    Partners: Female   Other Topics Concern  . 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 Alzheimer's disease Father     No Known Allergies  Medication list reviewed and updated in full in [place] Link.  GEN: No fevers, chills. Nontoxic. Primarily MSK c/o today. MSK: Detailed in  the HPI GI: tolerating PO intake without difficulty Neuro: No numbness, parasthesias, or tingling associated. Otherwise the pertinent positives of the ROS are noted above.   Objective:   BP 90/54 mmHg  Pulse 47  Temp(Src) 98.4 F (36.9 C) (Oral)  Ht  (1.753 m)  Wt 150 lb 8 oz (68.266 kg)  BMI 22.21 kg/m2   GEN: WDWN, NAD, Non-toxic, Alert & Oriented x 3 HEENT: Atraumatic, Normocephalic.  Ears and Nose: No external deformity. EXTR: No clubbing/cyanosis/edema NEURO: Normal gait.  PSYCH: Normally interactive. Conversant. Not depressed or anxious appearing.  Calm demeanor.   FEET: R Echymosis: no Edema: no ROM: full LE B Gait: heel toe, non-antalgic MT pain: pain  at 1 and 2 proximally Callus pattern: none Lateral Mall: NT Medial Mall: NT Talus: NT Navicular: NT Cuboid: NT Calcaneous: NT Metatarsals: NT 5th MT: NT Phalanges: NT Achilles: NT Plantar Fascia: NT Fat Pad: NT Peroneals: NT Post Tib: NT Great Toe: Nml motion Ant Drawer: neg ATFL: NT CFL: NT Deltoid: NT Other foot breakdown: none Long arch: preserved Transverse arch: preserved Hindfoot breakdown: none Sensation: intact   Radiology: Dg Foot Complete Right  01/05/2016  CLINICAL DATA:  Right foot pain after playing kickball yesterday EXAM: RIGHT FOOT COMPLETE - 3+ VIEW COMPARISON:  None. FINDINGS: Tarsal-metatarsal alignment is normal. Joint spaces appear normal. No fracture is seen. IMPRESSION: Negative. Electronically Signed   By: Dwyane Dee M.D.   On: 01/05/2016 13:24     Assessment and Plan:   Pain of right lower extremity - Plan: DG Foot Complete Right  GAD (generalized anxiety disorder) - Plan: Ambulatory referral to Psychology  Moderate single current episode of major depressive disorder (HCC) - Plan: Ambulatory referral to Psychology  Onychomycosis of toenail  Suspect probable bone contusion versus possible midfoot sprain.  This should resolve with time alone.  I think he is both depressed and having some anxiety.  We talked about this for a long time in the office.  His wife also thinks he is having some of these issues, particularly since his father passed away.  He is open to counseling, so I made a psychology referral, and wwe are also going to start him on some Prozac.  Close follow-up in 6 weeks.  Also encouraged him to do more aerobic exercise if at all possible.  Follow-up: Return in about 6 weeks (around 02/16/2016).  New Prescriptions   CICLOPIROX (PENLAC) 8 % SOLUTION    Apply topically at bedtime. Apply over nail and surrounding skin. Apply daily over previous coat. After seven (7) days, may remove with alcohol and continue cycle.   FLUOXETINE  (PROZAC) 20 MG CAPSULE    Take 1 capsule (20 mg total) by mouth daily.   Modified Medications   No medications on file   Orders Placed This Encounter  Procedures  . DG Foot Complete Right  . Ambulatory referral to Psychology    Signed,  Karleen Hampshire T. Ishan Sanroman, MD   Patient's Medications  New Prescriptions   CICLOPIROX (PENLAC) 8 % SOLUTION    Apply topically at bedtime. Apply over nail and surrounding skin. Apply daily over previous coat. After seven (7) days, may remove with alcohol and continue cycle.   FLUOXETINE (PROZAC) 20 MG CAPSULE    Take 1 capsule (20 mg total) by mouth daily.  Previous Medications   No medications on file  Modified Medications   No medications on file  Discontinued Medications   ALBENDAZOLE (ALBENZA) 200 MG TABLET  Take 2 tablets (400 mg total) by mouth daily.

## 2016-02-16 ENCOUNTER — Encounter: Payer: Self-pay | Admitting: Family Medicine

## 2016-02-16 ENCOUNTER — Ambulatory Visit (INDEPENDENT_AMBULATORY_CARE_PROVIDER_SITE_OTHER): Payer: BLUE CROSS/BLUE SHIELD | Admitting: Family Medicine

## 2016-02-16 VITALS — BP 90/60 | HR 42 | Temp 98.6°F | Ht 69.0 in | Wt 149.5 lb

## 2016-02-16 DIAGNOSIS — M79604 Pain in right leg: Secondary | ICD-10-CM

## 2016-02-16 DIAGNOSIS — F411 Generalized anxiety disorder: Secondary | ICD-10-CM | POA: Diagnosis not present

## 2016-02-16 DIAGNOSIS — F321 Major depressive disorder, single episode, moderate: Secondary | ICD-10-CM

## 2016-02-16 NOTE — Progress Notes (Signed)
Pre visit review using our clinic review tool, if applicable. No additional management support is needed unless otherwise documented below in the visit note. 

## 2016-02-16 NOTE — Progress Notes (Signed)
Dr. Karleen Hampshire T. Patrcia Schnepp, MD, [facility] Sports Medicine Primary Care and Sports Medicine [address] [place], [address] Phone: [phone] Fax: [phone]  02/16/2016  Patient: Angelito Hopping, MRN: [id number], DOB: April 14, 1986, 30 y.o.  Primary Physician:  Hannah Beat, MD   Chief Complaint  Patient presents with  . Follow-up    Right Foot Pain   Subjective:   Shahrukh Mory is a 30 y.o. very pleasant male patient who presents with the following:  Patient's right foot continues to have a little bit of pain, but is better somewhat.  He has been running on it without much difficulty, but some mild pain.  It does help when he wears his custom orthotics.  He never started his Prozac.  He does recognize that he is having some issues with some depression and irritability, but he wants to start with counseling first.  He wonders if some of this is secondary to his father's passing along with some difficulty in translation and transferred to [place] rather than [place].  Past Medical History, Surgical History, Social History, Family History, Problem List, Medications, and Allergies have been reviewed and updated if relevant.  Patient Active Problem List   Diagnosis Date Noted  . Vas deferens stricture 10/17/2013  . Allergic rhinitis due to pollen 07/25/2013  . Pipe smoker 07/25/2013    Past Medical History  Diagnosis Date  . Allergic rhinitis due to pollen 07/25/2013  . Pipe smoker 07/25/2013  . Complication of anesthesia     woke up during surgery with wisdom tooth    Past Surgical History  Procedure Laterality Date  . Appendectomy  1988  . Wisdom tooth extraction    . Inguinal hernia repair Right 10/09/2013    Procedure: Right Groin Exploration;  Surgeon: [name]. Corliss Skains, MD;  Location: [facility] OR;  Service: General;  Laterality: Right;    Social History   Social History  . Marital Status: Married    Spouse Name: Bo Merino  . Number of Children: 0  . Years of Education: N/A     Occupational History  . Engineer     [employer]   Social History Main Topics  . Smoking status: Current Some Day Smoker    Types: Pipe, Cigarettes  . Smokeless tobacco: Never Used  . Alcohol Use: 0.0 oz/week    0 Standard drinks or equivalent per week     Comment: social  . Drug Use: No  . Sexual Activity:    Partners: Female   Other Topics Concern  . Not on file   Social History Narrative   Married to patient Production manager at [employer]   From [place], [place]    Family History  Problem Relation Age of Onset  . Alzheimer's disease Father     No Known Allergies  Medication list reviewed and updated in full in [place] Link.   GEN: No acute illnesses, no fevers, chills. GI: No n/v/d, eating normally Pulm: No SOB Interactive and getting along well at home.  Otherwise, ROS is as per the HPI.  Objective:   BP 90/60 mmHg  Pulse 42  Temp(Src) 98.6 F (37 C) (Oral)  Ht  (1.753 m)  Wt 149 lb 8 oz (67.813 kg)  BMI 22.07 kg/m2  GEN: WDWN, NAD, Non-toxic, A & O x 3 HEENT: Atraumatic, Normocephalic. Neck supple. No masses, No LAD. Ears and Nose: No external deformity. CV: RRR, No M/G/R. No JVD. No thrill. No extra heart sounds. PULM: CTA B, no  wheezes, crackles, rhonchi. No retractions. No resp. distress. No accessory muscle use. EXTR: No c/c/e NEURO Normal gait.  PSYCH: Normally interactive. Conversant. Not depressed or anxious appearing.  Calm demeanor.   Foot remains minimally tender in the medially midfoot.  The remainder of the foot and ankle bones are unremarkable.  Laboratory and Imaging Data:  Assessment and Plan:   Pain of right lower extremity  GAD (generalized anxiety disorder)  Moderate single current episode of major depressive disorder (HCC)  I don't think that there is anything very significant with his foot.  Certainly midfoot sprain is possible.  I gave him arch binder for some support.  Think is a reasonable approach to begin  with counseling  Follow-up: No Follow-up on file.  Signed,  Elpidio GaleaSpencer T. Cotey Rakes, MD   Patient's Medications  New Prescriptions   No medications on file  Previous Medications   CICLOPIROX (PENLAC) 8 % SOLUTION    Apply topically at bedtime. Apply over nail and surrounding skin. Apply daily over previous coat. After seven (7) days, may remove with alcohol and continue cycle.   FLUOXETINE (PROZAC) 20 MG CAPSULE    Take 1 capsule (20 mg total) by mouth daily.  Modified Medications   No medications on file  Discontinued Medications   No medications on file

## 2016-07-17 ENCOUNTER — Telehealth: Payer: Self-pay

## 2016-07-17 NOTE — Telephone Encounter (Signed)
PLEASE NOTE: All timestamps contained within this report are represented as [place]. CONFIDENTIALTY NOTICE: This fax transmission is intended only for the addressee. It contains information that is legally privileged, confidential or otherwise protected from use or disclosure. If you are not the intended recipient, you are strictly prohibited from reviewing, disclosing, copying using or disseminating any of this information or taking any action in reliance on or regarding this information. If you have received this fax in error, please notify us immediately by telephone so that we can arrange for its return to us. Phone: [phone], Toll-Free: [phone], Fax: [phone] Page: 1 of 2 Call Id: [id number] [facility] [place] Night - Client TELEPHONE ADVICE RECORD [facility] Patient Name: Tyler Simon Gender: Male DOB: 09/11/1986 Age: [age] Y 8 M 23 D Return Phone Number: [phone] (Primary), [phone] (Secondary) Address: [place]: [place] [address] Client [facility] - Client [facility] - Night Physician Copland, Karleen HampshireSpencer - MD Contact Type Call Who Is Calling Patient / Member / Family / Caregiver Call Type Triage / Clinical Relationship To Patient Self Return Phone Number [phone] (Primary) Chief Complaint Cuts and Lacerations Reason for Call Symptomatic / Request for Health Information Initial Comment Caller states he was cut earlier this week and he got stitches; says it has opened up a little bit and he would like to know what he can use to keep it clean. Says it is itching and on one side is bubbling out. PreDisposition Go to ED Translation No Nurse Assessment Nurse: Harlon FlorWhitaker, RN, Darl PikesSusan Date/Time (Eastern Time): 07/15/2016 10:49:27 PM Confirm and document reason for call. If symptomatic, describe symptoms. You must click the next button to save  text entered. ---Caller states he was cut on right hand 07/12/16 little finger and ring finger by glass tube at work . (last tetanus shot was 10 yrs ago ) earlier this week and he got stitches in ED ; says it has opened up a little bit and he would like to know what he can use to keep it clean. Says it is itching and on one side is bubbling out. tips of each finger and has an opening and sees some flesh. He had a bulky gauze dressing on each finger. this was to be kept on 48hrs and then remove: he never did get written instructions. no fever Has the patient traveled out of the country within the last 30 days? ---No Does the patient have any new or worsening symptoms? ---Yes Will a triage be completed? ---Yes Related visit to physician within the last 2 weeks? ---No Does the PT have any chronic conditions? (i.e. diabetes, asthma, etc.) ---No Is this a behavioral health or substance abuse call? ---No Guidelines Guideline Title Affirmed Question Affirmed Notes Nurse Date/Time Lamount Cohen(Eastern Time) Post-Op Incision Symptoms [1] Suture came out early AND [2] wound gaping Harlon FlorWhitaker, RN, Darl PikesSusan 07/15/2016 10:56:32 PM PLEASE NOTE: All timestamps contained within this report are represented as [place] Standard Time. CONFIDENTIALTY NOTICE: This fax transmission is intended only for the addressee. It contains information that is legally privileged, confidential or otherwise protected from use or disclosure. If you are not the intended recipient, you are strictly prohibited from reviewing, disclosing, copying using or disseminating any of this information or taking any action in reliance on or regarding this information. If you have received this fax in error, please notify us immediately by telephone so that we can arrange for its return to  us. Phone: [phone], Toll-Free: [phone], Fax: [phone] Page: 2 of 2 Call Id: [id number] Guidelines Guideline Title Affirmed Question Affirmed Notes  Nurse Date/Time Lamount Cohen(Eastern Time) AND [3] < 48 hours since sutures placed Disp. Time Lamount Cohen(Eastern Time) Disposition Final User 07/15/2016 10:58:55 PM Go to ED Now (or PCP triage) Yes Harlon FlorWhitaker, RN, Helane RimaSusan Caller Understands: Yes Disagree/Comply: Comply Care Advice Given Per Guideline GO TO ED NOW (OR PCP TRIAGE): COVER: Cover the incision with clean gauze until seen. CARE ADVICE per Post-Op Incision Symptoms (Adult) guideline. [facility]

## 2016-07-17 NOTE — Telephone Encounter (Signed)
I spoke with pt and he spoke with someone at work and since [employer]worker's comp pt is going to see workers comp physician. Pt will cb if needed.

## 2016-08-18 DIAGNOSIS — N4611 Organic oligospermia: Secondary | ICD-10-CM | POA: Diagnosis not present

## 2016-08-22 DIAGNOSIS — Z3141 Encounter for fertility testing: Secondary | ICD-10-CM | POA: Diagnosis not present

## 2016-10-17 DIAGNOSIS — Z3141 Encounter for fertility testing: Secondary | ICD-10-CM | POA: Diagnosis not present

## 2016-12-20 ENCOUNTER — Ambulatory Visit (INDEPENDENT_AMBULATORY_CARE_PROVIDER_SITE_OTHER): Payer: BLUE CROSS/BLUE SHIELD | Admitting: Family Medicine

## 2016-12-20 ENCOUNTER — Encounter: Payer: Self-pay | Admitting: Family Medicine

## 2016-12-20 VITALS — BP 100/60 | HR 60 | Temp 98.9°F | Ht 69.0 in | Wt 151.2 lb

## 2016-12-20 DIAGNOSIS — M779 Enthesopathy, unspecified: Principal | ICD-10-CM

## 2016-12-20 DIAGNOSIS — M778 Other enthesopathies, not elsewhere classified: Secondary | ICD-10-CM

## 2016-12-20 NOTE — Progress Notes (Signed)
Dr. Karleen Hampshire T. Brogan Martis, MD, CAQ Sports Medicine Primary [facility] and Sports Medicine [address] [place], [address] Phone: [phone] Fax: [phone]  12/20/2016  Patient: Tyler Simon, MRN: [id number], DOB: 1986-05-13, 31 y.o.  Primary Physician:  Hannah Beat, MD   Chief Complaint  Patient presents with  . Elbow Pain    Right   Subjective:   Tyler Simon is a 31 y.o. very pleasant male patient who presents with the following:  R elbow pain:   Pain and feels tender at the lateral aspect of the elbow. In further discussion, his pain is really more proximal to the olecranon. Has been there a few weeks. He is concerned about this since it is been going on so long, and he wants to be up to play tennis this summer.  Lifting some packages up to 20 days.   RHD.   Past Medical History, Surgical History, Social History, Family History, Problem List, Medications, and Allergies have been reviewed and updated if relevant.  Patient Active Problem List   Diagnosis Date Noted  . Vas deferens stricture 10/17/2013  . Allergic rhinitis due to pollen 07/25/2013  . Pipe smoker 07/25/2013    Past Medical History:  Diagnosis Date  . Allergic rhinitis due to pollen 07/25/2013  . Complication of anesthesia    woke up during surgery with wisdom tooth  . Pipe smoker 07/25/2013    Past Surgical History:  Procedure Laterality Date  . APPENDECTOMY  1988  . INGUINAL HERNIA REPAIR Right 10/09/2013   Procedure: Right Groin Exploration;  Surgeon: [name]. Corliss Skains, MD;  Location: [facility] OR;  Service: General;  Laterality: Right;  . WISDOM TOOTH EXTRACTION      Social History   Social History  . Marital status: Married    Spouse name: Tyler Simon  . Number of children: 0  . Years of education: N/A   Occupational History  . Engineer     [employer]   Social History Main Topics  . Smoking status: Current Some Day Smoker    Types: Pipe, Cigarettes  . Smokeless tobacco: Never Used  .  Alcohol use 0.0 oz/week     Comment: social  . Drug use: No  . Sexual activity: Yes    Partners: Female   Other Topics Concern  . Not on file   Social History Narrative   Married to patient Production manager at [employer]   From [place], [place]    Family History  Problem Relation Age of Onset  . Alzheimer's disease Father     No Known Allergies  Medication list reviewed and updated in full in [facility].  GEN: No fevers, chills. Nontoxic. Primarily MSK c/o today. MSK: Detailed in the HPI GI: tolerating PO intake without difficulty Neuro: No numbness, parasthesias, or tingling associated. Otherwise the pertinent positives of the ROS are noted above.   Objective:   BP 100/60   Pulse 60   Temp 98.9 F (37.2 C) (Oral)   Ht  (1.753 m)   Wt 151 lb 4 oz (68.6 kg)   BMI 22.34 kg/m    GEN: WDWN, NAD, Non-toxic, Alert & Oriented x 3 HEENT: Atraumatic, Normocephalic.  Ears and Nose: No external deformity. EXTR: No clubbing/cyanosis/edema NEURO: Normal gait.  PSYCH: Normally interactive. Conversant. Not depressed or anxious appearing.  Calm demeanor.   R elbow Ecchymosis or edema: neg ROM: full flexion, extension, pronation, supination Shoulder ROM: Full Flexion: 5/5 Extension: 4/5 - pain, ttp at distal  triceps tendon Supination: 5/5  Pronation: 5/5 Wrist ext: 5/5 Wrist flexion: 5/5 No gross bony abnormality Varus and Valgus stress: stable ECRB tenderness: neg Medial epicondyle: NT Lateral epicondyle, resisted wrist extension from wrist full pronation and flexion: NT grip: 5/5  sensation intact Tinel's, Elbow: negative   Radiology: No results found.  Assessment and Plan:   Triceps tendinitis  Right-sided new-onset triceps tendinopathy.  Insertional.  Reviewed basic rehabilitation.  Recommended compression sleeve when working out or playing tennis.  Regular NSAIDs over the next 2 weeks.  Follow-up: No Follow-up on file.  Medications  Discontinued During This Encounter  Medication Reason  . FLUoxetine (PROZAC) 20 MG capsule Completed Course  . ciclopirox (PENLAC) 8 % solution Completed Course   Signed,  Karleen Hampshire T. Datra Clary, MD   Allergies as of 12/20/2016   No Known Allergies     Medication List    as of 12/20/2016 11:59 PM   You have not been prescribed any medications.

## 2016-12-20 NOTE — Progress Notes (Signed)
Pre visit review using our clinic review tool, if applicable. No additional management support is needed unless otherwise documented below in the visit note. 

## 2017-02-09 ENCOUNTER — Ambulatory Visit (INDEPENDENT_AMBULATORY_CARE_PROVIDER_SITE_OTHER): Payer: BLUE CROSS/BLUE SHIELD | Admitting: Sports Medicine

## 2017-02-09 ENCOUNTER — Encounter: Payer: Self-pay | Admitting: Sports Medicine

## 2017-02-09 VITALS — BP 110/72 | HR 51 | Ht 69.0 in | Wt 149.2 lb

## 2017-02-09 DIAGNOSIS — M216X1 Other acquired deformities of right foot: Secondary | ICD-10-CM

## 2017-02-09 DIAGNOSIS — M7742 Metatarsalgia, left foot: Secondary | ICD-10-CM | POA: Diagnosis not present

## 2017-02-09 DIAGNOSIS — M7741 Metatarsalgia, right foot: Secondary | ICD-10-CM

## 2017-02-09 NOTE — Progress Notes (Signed)
OFFICE VISIT NOTE Veverly FellsMichael D. Delorise Shinerigby, DO  [place] [facility] [phone]  Tyler Simon - 31 y.o. male MRN [id number]  Date of birth: 12/27/1985  Visit Date: 02/09/2017  PCP: Hannah Beatopland, Spencer, MD   Referred by: Hannah Beatopland, Spencer, MD  Orlie DakinBrandy Shelton, CMA acting as scribe for Dr. Berline Choughigby.  SUBJECTIVE:   Chief Complaint  Patient presents with  . Foot Pain    Right   HPI: As below and per problem based documentation when appropriate.  Pt presents today with complaint of right foot pain Pain started on Monday.  No known injury or trauma. The only thing he could think of is that he stood on hip tip toes in this wife's shoes to run outside for a minute. He says that he also has some hiking shoes that he wore and the sole of the shoe is missing. He says that every time he steps he feels pain. He does a lot of walking with his job. Pt has flat feet  The pain is described as stabbing and is rated as 6/10.  Worsened with walking Improves with resting, soaking feet in warm water and Epson salt Therapies tried include : custom orthotics with large arch support. He has orthotics in his running shoes. He used the machine that you step on and it recommends which inserts your should buy.   Other associated symptoms include: "big toe" on right foot hurts and radiates up the the top of the foot. Pt has pain in the posterior aspect of the right knee.   Pt denies fever, chills, night sweats.      Review of Systems  Constitutional: Negative for chills and fever.  HENT: Negative.   Eyes: Negative.   Respiratory: Negative for shortness of breath and wheezing.   Cardiovascular: Negative for chest pain, palpitations and leg swelling.  Gastrointestinal: Negative.   Genitourinary: Negative.   Musculoskeletal: Negative for falls.  Skin: Negative.   Neurological: Negative for dizziness, tingling and headaches.  Endo/Heme/Allergies: Does not bruise/bleed  easily.    Otherwise per HPI.  HISTORY & PERTINENT PRIOR DATA:  No specialty comments available. He reports that he has been smoking Pipe and Cigarettes.  He has never used smokeless tobacco. No results for input(s): HGBA1C, LABURIC in the last 8760 hours. Medications & Allergies reviewed per EMR Patient Active Problem List   Diagnosis Date Noted  . Metatarsalgia of both feet 02/09/2017  . Vas deferens stricture 10/17/2013  . Allergic rhinitis due to pollen 07/25/2013  . Pipe smoker 07/25/2013   Past Medical History:  Diagnosis Date  . Allergic rhinitis due to pollen 07/25/2013  . Complication of anesthesia    woke up during surgery with wisdom tooth  . Pipe smoker 07/25/2013   Family History  Problem Relation Age of Onset  . Alzheimer's disease Father    Past Surgical History:  Procedure Laterality Date  . APPENDECTOMY  1988  . INGUINAL HERNIA REPAIR Right 10/09/2013   Procedure: Right Groin Exploration;  Surgeon: Wilmon ArmsMatthew K. Corliss Skainssuei, MD;  Location: [facility] OR;  Service: General;  Laterality: Right;  . WISDOM TOOTH EXTRACTION     Social History   Occupational History  . Engineer     [employer]   Social History Main Topics  . Smoking status: Current Some Day Smoker    Types: Pipe, Cigarettes  . Smokeless tobacco: Never Used  . Alcohol use 0.0 oz/week     Comment: social  . Drug use: No  .  Sexual activity: Yes    Partners: Female    OBJECTIVE:  VS:  HT:5\' 9"  (175.3 cm)   WT:149 lb 3.2 oz (67.7 kg)  BMI:22.1    BP:110/72  HR:(!) 51bpm  TEMP: ( )  RESP:98 % EXAM: Findings:  WDWN, NAD, Non-toxic appearing Alert & appropriately interactive Not depressed or anxious appearing No increased work of breathing. Pupils are equal. EOM intact without nystagmus No clubbing or cyanosis of the extremities appreciated No significant rashes/lesions/ulcerations overlying the examined area. DP & PT pulses 2+/4.  No significant pretibial edema. Sensation intact to light touch in lower  extremities.  Bilateral feet: Overall longitudinal arch is moderate while nonweightbearing does have some collapse with posterior tibialis insufficiency type I on the left. He has splay toe of his lateral column both feet with in curling of his fifth toe.  Mild pain with metatarsal squeeze bilaterally. Bilateral ankle dorsiflexion to 110.  Normal range of motion of his great toe.     No results found. ASSESSMENT & PLAN:   Problem List Items Addressed This Visit    Metatarsalgia of both feet - Primary    Patient has had issues with transverse arch breakdown for quite some time.  He does have splay toe of his lateral column bilaterally and is likely developing some component of a small Morton's neuroma given slight amount of pain.  We discussed multiple options with him today including improvement in his shoe wear which is already fairly good but does not provide enough transverse arch support.  A small metatarsal pad was added to his [name]'s reported improvement in his symptoms.  ultimately benefit from custom cushion orthotics and this was discussed with him.  He will call us if he is interested for [employer].       Other Visit Diagnoses    Loss of transverse plantar arch of right foot          Follow-up: Return if symptoms worsen or fail to improve, for Orthotics.   CMA/ATC served as Neurosurgeon during this visit. History, Physical, and Plan performed by medical provider. Documentation and orders reviewed and attested to.      Gaspar Bidding, DO    [facility] Physician

## 2017-02-09 NOTE — Patient Instructions (Signed)
Look into having your insurance company cover a set of custom orthotics.  The code is L3030 and there are 2 units.  You can call them  and ask if this is covered.  I am happy to do these for you at any time, you just need to let our front office schedulers know you would like an "orthotic appointment."  

## 2017-02-16 ENCOUNTER — Ambulatory Visit (INDEPENDENT_AMBULATORY_CARE_PROVIDER_SITE_OTHER): Payer: BLUE CROSS/BLUE SHIELD | Admitting: Sports Medicine

## 2017-02-16 ENCOUNTER — Encounter: Payer: Self-pay | Admitting: Sports Medicine

## 2017-02-16 VITALS — BP 110/80 | HR 63 | Ht 69.0 in | Wt 149.0 lb

## 2017-02-16 DIAGNOSIS — M7741 Metatarsalgia, right foot: Secondary | ICD-10-CM

## 2017-02-16 DIAGNOSIS — M216X1 Other acquired deformities of right foot: Secondary | ICD-10-CM

## 2017-02-16 DIAGNOSIS — M7742 Metatarsalgia, left foot: Secondary | ICD-10-CM

## 2017-02-16 NOTE — Assessment & Plan Note (Signed)
Given lack of improvement with modifications of [name]'s insoles custom cushioned insoles indicated.  PROCEDURE: CUSTOM ORTHOTIC FABRICATION Patient's underlying musculoskeletal conditions are directly related to poor biomechanics and will benefit from a functional custom orthotic.  There are no significant foot deformities that complicate the use of a custom orthotic.  The patient was fitted for a standard, cushioned, semi-rigid orthotic. The orthotic was heated & placed on the orthotic stand. The patient was positioned in subtalar neutral position and 10 of ankle dorsiflexion and weight bearing stance some heated orthotic blank. After completion of the molding a stable paste was applied to the orthotic blank. The orthotic was ground to a stable position for weightbearing. The patient ambulated in these and reported they were comfortable without pressure spots.              BLANK:  Size 10 - Standard Cushioned                 BASE:  Blue EVA      POSTINGS:  n/a

## 2017-02-16 NOTE — Progress Notes (Signed)
  OFFICE VISIT NOTE Tyler FellsMichael D. Delorise Shinerigby, DO  [facility] [phone]  Susan Rejeana Brocketit - 31 y.o. male MRN [id number]  Date of birth: 08/12/1986  Visit Date: 02/16/2017  PCP: Tyler Simon, Spencer, MD   Referred by: Tyler Simon, Spencer, MD  Tyler DakinBrandy Simon, CMA acting as scribe for Dr. Berline Simon.  SUBJECTIVE:   Chief Complaint  Patient presents with  . Bilateral Foot Pain   HPI: As below and per problem based documentation when appropriate.  Tyler Simon presents today in follow-up of Metatarsalgia of both feet and loss of transverse plantar arch of right foot. Since last visit he has activey wore the insoles from last visit with relief in both feet. There is worseninng pain in the RT posterior knee. He is still unable to walk barefoot without pain. Agreeable for orthotics/inserts for his shoes today.      ROS  Otherwise per HPI.  OBJECTIVE:  VS:  HT:5\' 9"  (175.3 cm)   WT:149 lb (67.6 kg)  BMI:22    BP:110/80  HR:63bpm  TEMP: ( )  RESP:98 % EXAM: Findings:  Loss of transverse arch Persistent TTP over R>L metatarsal heads, worse on lateral column Left posterior tib insuffic type 1    No results found. ASSESSMENT & PLAN:   Problem List Items Addressed This Visit    Metatarsalgia of both feet    Given lack of improvement with modifications of Tyler Simon insoles custom cushioned insoles indicated.  PROCEDURE: CUSTOM ORTHOTIC FABRICATION Patient's underlying musculoskeletal conditions are directly related to poor biomechanics and will benefit from a functional custom orthotic.  There are no significant foot deformities that complicate the use of a custom orthotic.  The patient was fitted for a standard, cushioned, semi-rigid orthotic. The orthotic was heated & placed on the orthotic stand. The patient was positioned in subtalar neutral position and 10 of ankle dorsiflexion and weight bearing stance some heated orthotic blank. After completion of the  molding a stable paste was applied to the orthotic blank. The orthotic was ground to a stable position for weightbearing. The patient ambulated in these and reported they were comfortable without pressure spots.              BLANK:  Size 10 - Standard Cushioned                 BASE:  Blue EVA      POSTINGS:  n/a        Other Visit Diagnoses    Loss of transverse plantar arch of right foot    -  Primary      Follow-up: Return if symptoms worsen or fail to improve.   CMA/ATC served as Neurosurgeonscribe during this visit. History, Physical, and Plan performed by medical provider. Documentation and orders reviewed and attested to.      Tyler BiddingMichael Doral Ventrella, DO    [facility]

## 2017-02-18 NOTE — Assessment & Plan Note (Signed)
Patient has had issues with transverse arch breakdown for quite some time.  He does have splay toe of his lateral column bilaterally and is likely developing some component of a small Morton's neuroma given slight amount of pain.  We discussed multiple options with him today including improvement in his shoe wear which is already fairly good but does not provide enough transverse arch support.  A small metatarsal pad was added to his [name]'s reported improvement in his symptoms.  ultimately benefit from custom cushion orthotics and this was discussed with him.  He will call us if he is interested for [employer].

## 2017-04-24 ENCOUNTER — Telehealth: Payer: Self-pay | Admitting: Family Medicine

## 2017-04-24 ENCOUNTER — Encounter: Payer: Self-pay | Admitting: Family Medicine

## 2017-04-24 ENCOUNTER — Encounter (INDEPENDENT_AMBULATORY_CARE_PROVIDER_SITE_OTHER): Payer: Self-pay

## 2017-04-24 ENCOUNTER — Ambulatory Visit (INDEPENDENT_AMBULATORY_CARE_PROVIDER_SITE_OTHER): Payer: BLUE CROSS/BLUE SHIELD | Admitting: Family Medicine

## 2017-04-24 VITALS — BP 102/64 | HR 60 | Temp 98.2°F | Ht 69.0 in | Wt 154.2 lb

## 2017-04-24 DIAGNOSIS — B001 Herpesviral vesicular dermatitis: Secondary | ICD-10-CM | POA: Insufficient documentation

## 2017-04-24 MED ORDER — VALACYCLOVIR HCL 1 G PO TABS: 2000.0000 mg | ORAL_TABLET | Freq: Two times a day (BID) | ORAL | 3 refills | Status: DC

## 2017-04-24 NOTE — Telephone Encounter (Signed)
Pt has appt with Dr Milinda Antisower 04/24/17 at 3:45.

## 2017-04-24 NOTE — Telephone Encounter (Signed)
Patient Name: Tyler Simon  DOB: 10/20/1985    Initial Comment Caller states he woke up with 4 huge blisters around mouth, never been more than one at a time, itchy, painful, and big. He is wondering what to do next, feeling exhausted.    Nurse Assessment  Nurse: Bing PlumeHaynes, RN, Erin Date/Time (Eastern Time): 04/24/2017 8:51:31 AM  Confirm and document reason for call. If symptomatic, describe symptoms. ---caller states he usually has 1 sore on his mouth when he is tired or stressed. Today has 4 that are itching and painful. Has been under stress and is tired. They are on the outside of lips. No fever but feels nausea  Does the patient have any new or worsening symptoms? ---Yes  Will a triage be completed? ---Yes  Related visit to physician within the last 2 weeks? ---No  Does the PT have any chronic conditions? (i.e. diabetes, asthma, etc.) ---No  Is this a behavioral health or substance abuse call? ---No     Guidelines    Guideline Title Affirmed Question Affirmed Notes  Cold Sores (Fever Blisters) [1] Red area spreading from cold sore AND [2] large (> 2 in. or 5 cm)    Final Disposition User   See Physician within 4 Hours (or PCP triage) Bing PlumeHaynes, RN, Denny PeonErin    Comments  has appt with Dr Kathreen Devoidowne at [phone]  Spoke with Triage nurse Rena who agreed to afternoon appt IF pt condition remains stable. If any trouble breathing or swallowing he would need to proceed to ED immediately. Pt verbalized understanding   Referrals  REFERRED TO PCP OFFICE  REFERRED TO PCP OFFICE  REFERRED TO PCP OFFICE   Disagree/Comply: Comply

## 2017-04-24 NOTE — Telephone Encounter (Signed)
I will see him then

## 2017-04-24 NOTE — Progress Notes (Signed)
Subjective:    Patient ID: Tyler Simon, male    DOB: 03/02/1986, 31 y.o.   MRN: [id number]  HPI Here for blisters on lips with malaise   Has hx of cold sores  Usually gets them occasionally  Last night woke up in the middle of the night- itchy/tingly feeling on lips so he thought he was going to get one  He woke up with 4 of them  Both lips and both sides   Has never taken valtrex Using acyclovir cream now    Exhausted - could not make a full day at work today  Sort of dizzy and weakn No fever but feels like he could get one  A little nauseated  Had a headache this am -that is gone  He took analgesic /nsaid this am   Trying to drink a lot of fluids   No sores in mouth or throat  Oddly ST on L side Sunday that went away   Was in the sun all day (motorcycle lesson) that day     No sick contacts   Some day smoker   Patient Active Problem List   Diagnosis Date Noted  . Recurrent cold sores 04/24/2017  . Metatarsalgia of both feet 02/09/2017  . Vas deferens stricture 10/17/2013  . Allergic rhinitis due to pollen 07/25/2013  . Pipe smoker 07/25/2013   Past Medical History:  Diagnosis Date  . Allergic rhinitis due to pollen 07/25/2013  . Complication of anesthesia    woke up during surgery with wisdom tooth  . Pipe smoker 07/25/2013   Past Surgical History:  Procedure Laterality Date  . APPENDECTOMY  1988  . INGUINAL HERNIA REPAIR Right 10/09/2013   Procedure: Right Groin Exploration;  Surgeon: Wilmon ArmsMatthew K. Corliss Skainssuei, MD;  Location: [facility] OR;  Service: General;  Laterality: Right;  . WISDOM TOOTH EXTRACTION     Social History  Substance Use Topics  . Smoking status: Current Some Day Smoker    Types: Pipe, Cigarettes  . Smokeless tobacco: Never Used  . Alcohol use 0.0 oz/week     Comment: social   Family History  Problem Relation Age of Onset  . Alzheimer's disease Father    No Known Allergies No current outpatient prescriptions on file prior to visit.   No  current facility-administered medications on file prior to visit.      Review of Systems Review of Systems  Constitutional: Negative for fever, appetite change,  and unexpected weight change. pos for fatigue and malaise ENT pos for pnd / neg for sinus pain  Eyes: Negative for pain and visual disturbance.  Respiratory: Negative for cough and shortness of breath.   Cardiovascular: Negative for cp or palpitations    Gastrointestinal: Negative for nausea, diarrhea and constipation.  Genitourinary: Negative for urgency and frequency.  Skin: Negative for pallor or rash  pos for cold sores on lips  Neurological: Negative for weakness, light-headedness, numbness and headaches.  Hematological: Negative for adenopathy. Does not bruise/bleed easily.  Psychiatric/Behavioral: Negative for dysphoric mood. The patient is not nervous/anxious.         Objective:   Physical Exam  Constitutional: He appears well-developed and well-nourished. No distress.  Well appearing   HENT:  Head: Normocephalic and atraumatic.  Right Ear: External ear normal.  Left Ear: External ear normal.  Mouth/Throat: Oropharynx is clear and moist.  Boggy nares pnd  No lesions in mouth  Cold sores (vesicular Karel Jarvis/ulcers)  - 4 seen on upper and lower lips -both left  and right (they are tender No rash on face or swelling   Eyes: Pupils are equal, round, and reactive to light. Conjunctivae and EOM are normal. Right eye exhibits no discharge. Left eye exhibits no discharge. No scleral icterus.  Neck: Normal range of motion. Neck supple.  Cardiovascular: Normal rate and regular rhythm.   No murmur heard. Pulmonary/Chest: Effort normal and breath sounds normal. No respiratory distress. He has no wheezes. He has no rales.  Musculoskeletal: He exhibits no edema.  Lymphadenopathy:    He has no cervical adenopathy.  Neurological: He is alert. No cranial nerve deficit.  Skin: Skin is warm. No pallor.  See ENT exam- 4 cold sores  noted   Psychiatric: He has a normal mood and affect.          Assessment & Plan:   Problem List Items Addressed This Visit      Digestive   Recurrent cold sores    Worst outbreak he has had- 4 sores/vesicular lesions on upper lower (L and R)  Also some general malaise -? If early viral uri  Px valcyclovir 2 g bid for 1 d  (also refills for the future)  Disc symptomatic care - see instructions on AVS  Update if not starting to improve in a week or if worsening        Relevant Medications   valACYclovir (VALTREX) 1000 MG tablet

## 2017-04-24 NOTE — Patient Instructions (Addendum)
I think you have cold sores caused by the herpes virus  Take valcylovir 2 pills when you get home and then 2 pills 12 hours later   I gave you refills to take for outbreaks in the future   Sun exposure/fatigue and possibly a virus may have triggered this particularly severe episode Get some rest  Drink fluids / use a straw   Over the counter anti inflammatory or tylenol is fine

## 2017-04-24 NOTE — Assessment & Plan Note (Signed)
Worst outbreak he has had- 4 sores/vesicular lesions on upper lower (L and R)  Also some general malaise -? If early viral uri  Px valcyclovir 2 g bid for 1 d  (also refills for the future)  Disc symptomatic care - see instructions on AVS  Update if not starting to improve in a week or if worsening

## 2017-05-02 DIAGNOSIS — Z3189 Encounter for other procreative management: Secondary | ICD-10-CM | POA: Diagnosis not present

## 2017-10-16 DIAGNOSIS — Z3189 Encounter for other procreative management: Secondary | ICD-10-CM | POA: Diagnosis not present

## 2017-10-22 ENCOUNTER — Ambulatory Visit: Payer: BLUE CROSS/BLUE SHIELD | Admitting: Family Medicine

## 2017-10-22 ENCOUNTER — Other Ambulatory Visit: Payer: Self-pay

## 2017-10-22 ENCOUNTER — Encounter: Payer: Self-pay | Admitting: Internal Medicine

## 2017-10-22 ENCOUNTER — Encounter: Payer: Self-pay | Admitting: Family Medicine

## 2017-10-22 VITALS — BP 102/60 | HR 55 | Temp 98.5°F | Ht 69.0 in | Wt 161.0 lb

## 2017-10-22 DIAGNOSIS — K644 Residual hemorrhoidal skin tags: Secondary | ICD-10-CM | POA: Diagnosis not present

## 2017-10-22 DIAGNOSIS — G479 Sleep disorder, unspecified: Secondary | ICD-10-CM | POA: Diagnosis not present

## 2017-10-22 MED ORDER — HYDROCORTISONE ACE-PRAMOXINE 2.5-1 % RE CREA: 1.0000 "application " | TOPICAL_CREAM | Freq: Three times a day (TID) | RECTAL | 1 refills | Status: DC

## 2017-10-22 NOTE — Progress Notes (Signed)
Dr. Karleen HampshireSpencer T. Aaliyan Brinkmeier, MD, CAQ Sports [facility] [address] [place], [address] Phone: [phone] Fax: [phone]  10/22/2017  Patient: Tyler Simon, MRN: [id number], DOB: 11/02/1985, 32 y.o.  Primary Physician:  Hannah Beatopland, Alisha Burgo, MD   Chief Complaint  Patient presents with  . Hemorrhoids   Subjective:   [name] is a 32 y.o. very pleasant male patient who presents with the following:  4 days bothering him - tried some lidocaine, ad and also some cold pack with paper. Advil and pain to walk. First time has had them like this. Had some swelling - snowboarding and meat and cheese. Able to snowboard. Advil kind of all day long.   Inferior hemorrhoid -   He is also worried that he may have OSA since his mom and other relatives do, and he is not well rested in the morning.   Past Medical History, Surgical History, Social History, Family History, Problem List, Medications, and Allergies have been reviewed and updated if relevant.  Patient Active Problem List   Diagnosis Date Noted  . Recurrent cold sores 04/24/2017  . Metatarsalgia of both feet 02/09/2017  . Vas deferens stricture 10/17/2013  . Allergic rhinitis due to pollen 07/25/2013  . Pipe smoker 07/25/2013    Past Medical History:  Diagnosis Date  . Allergic rhinitis due to pollen 07/25/2013  . Complication of anesthesia    woke up during surgery with wisdom tooth  . Pipe smoker 07/25/2013    Past Surgical History:  Procedure Laterality Date  . APPENDECTOMY  1988  . INGUINAL HERNIA REPAIR Right 10/09/2013   Procedure: Right Groin Exploration;  Surgeon: Wilmon ArmsMatthew K. Corliss Skainssuei, MD;  Location: [facility] OR;  Service: General;  Laterality: Right;  . WISDOM TOOTH EXTRACTION      Social History   Socioeconomic History  . Marital status: Married    Spouse name: Bo MerinoGeri  . Number of children: 0  . Years of education: Not on file  . Highest education level: Not on file  Social Needs    . Financial resource strain: Not on file  . Food insecurity - worry: Not on file  . Food insecurity - inability: Not on file  . Transportation needs - medical: Not on file  . Transportation needs - non-medical: Not on file  Occupational History  . Occupation: Art gallery managerngineer    Comment: [employer]  Tobacco Use  . Smoking status: Current Some Day Smoker    Types: Pipe, Cigarettes  . Smokeless tobacco: Never Used  Substance and Sexual Activity  . Alcohol use: Yes    Alcohol/week: 0.0 oz    Comment: social  . Drug use: No  . Sexual activity: Yes    Partners: Female  Other Topics Concern  . 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 Alzheimer's disease Father     No Known Allergies  Medication list reviewed and updated in full in [place] [facility].   GEN: No acute illnesses, no fevers, chills. GI: No n/v/d, eating normally Pulm: No SOB Interactive and getting along well at home.  Otherwise, ROS is as per the HPI.  Objective:   BP 102/60   Pulse (!) 55   Temp 98.5 F (36.9 C) (Oral)   Ht 5\' 9"  (1.753 m)   Wt 161 lb (73 kg)  BMI 23.78 kg/m   GEN: WDWN, NAD, Non-toxic, A & O x 3 HEENT: Atraumatic, Normocephalic. Neck supple. No masses, No LAD. Ears and Nose: No external deformity. CV: RRR, No M/G/R. No JVD. No thrill. No extra heart sounds. PULM: CTA B, no wheezes, crackles, rhonchi. No retractions. No resp. distress. No accessory muscle use. EXTR: No c/c/e NEURO Normal gait.  PSYCH: Normally interactive. Conversant. Not depressed or anxious appearing.  Calm demeanor.   Laboratory and Imaging Data:  Assessment and Plan:   External hemorrhoid - Plan: Ambulatory referral to Gastroenterology  Sleep difficulties - Plan: Ambulatory referral to Pulmonology  Obvious external hemorrhoid, will treat this now with some steroids, and hopefully this  will be amenable to banding.  Were going to have him see GI for their input.  He is worried about potential OSA, and given his symptoms it is certainly reasonable I think to evaluate this further.  He does have a normal body habitus.  Follow-up: No Follow-up on file.  Meds ordered this encounter  Medications  . hydrocortisone-pramoxine (ANALPRAM-HC) 2.5-1 % rectal cream    Sig: Place 1 application rectally 3 (three) times daily.    Dispense:  30 g    Refill:  1   Medications Discontinued During This Encounter  Medication Reason  . valACYclovir (VALTREX) 1000 MG tablet Completed Course   Orders Placed This Encounter  Procedures  . Ambulatory referral to Pulmonology  . Ambulatory referral to Gastroenterology    Signed,  Karleen Hampshire T. Correna Meacham, MD   Allergies as of 10/22/2017   No Known Allergies     Medication List        Accurate as of 10/22/17  1:28 PM. Always use your most recent med list.          hydrocortisone-pramoxine 2.5-1 % rectal cream Commonly known as:  ANALPRAM-HC Place 1 application rectally 3 (three) times daily.

## 2017-10-22 NOTE — Patient Instructions (Signed)

## 2017-10-29 ENCOUNTER — Encounter: Payer: Self-pay | Admitting: Internal Medicine

## 2017-10-29 ENCOUNTER — Ambulatory Visit: Payer: BLUE CROSS/BLUE SHIELD | Admitting: Internal Medicine

## 2017-10-29 VITALS — BP 98/66 | HR 57 | Ht 69.0 in | Wt 161.0 lb

## 2017-10-29 DIAGNOSIS — K645 Perianal venous thrombosis: Secondary | ICD-10-CM

## 2017-10-29 DIAGNOSIS — K648 Other hemorrhoids: Secondary | ICD-10-CM

## 2017-10-29 MED ORDER — NYSTATIN 100000 UNIT/GM EX CREA: 1.0000 "application " | TOPICAL_CREAM | Freq: Three times a day (TID) | CUTANEOUS | 0 refills | Status: DC | PRN

## 2017-10-29 MED ORDER — HYDROCORTISONE ACETATE 25 MG RE SUPP: 25.0000 mg | Freq: Every day | RECTAL | 1 refills | Status: DC

## 2017-10-29 NOTE — Progress Notes (Signed)
Patient ID: Tyler Simon, male   DOB: October 27, 1985, 32 y.o.   MRN: [id number] HPI: Tyler Simon is a 32 year old male with little past medical history who was seen in consultation at the request of Dr. Patsy Lager for evaluation of symptomatic hemorrhoids.  He is here alone today.  The patient reports that over the last 2-3 years he has had intermittent symptoms with hemorrhoids.  Most recently for nearly a week in late January and early February 2019 he developed a very painful swollen hemorrhoid.  This was painful worse with movement including walking and exercising.  He was using over-the-counter hemorrhoidal cream as well as Advil every 4 hours to help with the pain associated with his hemorrhoid.  He also used some topical over-the-counter lidocaine.  Analpram was prescribed but too expensive so he was unable to start this medication.  Now the pain has resolved but he still feels a perianal soft nodule which he states was the area previously painful.  He reports regular bowel movements occurring once to occasionally twice daily.  Stools are often soft but formed.  He denies constipation and very rarely has diarrhea.  No abdominal pain.  He has not had rectal bleeding or melena.  He describes an intermittent perianal rash which can give a burning and itchy sensation which seems to come and go.  He also intermittently feels perianal swelling and irritation also which comes and goes.  No upper GI or hepatobiliary complaint.  No family history of colon cancer or IBD.  He works as an Art gallery manager.  He is originally from [place].  He does smoke cigarettes.  He averages about 1 alcoholic drink per day.  No illicit drug use.  Past Medical History:  Diagnosis Date  . Allergic rhinitis due to pollen 07/25/2013  . Complication of anesthesia    woke up during surgery with wisdom tooth  . External hemorrhoid   . Pipe smoker 07/25/2013    Past Surgical History:  Procedure Laterality Date  . APPENDECTOMY  1988  .  INGUINAL HERNIA REPAIR Right 10/09/2013   Procedure: Right Groin Exploration;  Surgeon: [name]. Corliss Skains, MD;  Location: [facility] OR;  Service: General;  Laterality: Right;  . WISDOM TOOTH EXTRACTION      Outpatient Medications Prior to Visit  Medication Sig Dispense Refill  . hydrocortisone-pramoxine (ANALPRAM-HC) 2.5-1 % rectal cream Place 1 application rectally 3 (three) times daily. 30 g 1   No facility-administered medications prior to visit.     No Known Allergies  Family History  Problem Relation Age of Onset  . Alzheimer's disease Father   . Colon cancer Neg Hx   . Stomach cancer Neg Hx     Social History   Tobacco Use  . Smoking status: Current Some Day Smoker    Types: Pipe, Cigarettes  . Smokeless tobacco: Never Used  Substance Use Topics  . Alcohol use: Yes    Alcohol/week: 0.0 oz    Comment: social  . Drug use: No    ROS: As per history of present illness, otherwise negative  BP 98/66   Pulse (!) 57   Ht 5\' 9"  (1.753 m)   Wt 161 lb (73 kg)   BMI 23.78 kg/m  Constitutional: Well-developed and well-nourished. No distress. HEENT: Normocephalic and atraumatic Conjunctivae are normal.  No scleral icterus. Neck: Neck supple. Trachea midline. Cardiovascular: Normal rate, regular rhythm and intact distal pulses.  Pulmonary/chest: Effort normal and breath sounds normal. No wheezing, rales or rhonchi. Abdominal: Soft, nontender, nondistended. Bowel  sounds active throughout.  Rectal/Anoscopy: small nontender right external hemorrhoid, no other perianal abnormality.  DRE with no masses, tenderness, soft brown stool in the vault ANOSCOPY: Using a disposable, lubricated, slotted, self-illuminating anoscope, the rectum was intubated without difficulty. The trochar was removed and the ano-rectum was circumferentially inspected. There were internal hemorrhoids, RA=LL. There was no finding of an anorectal fissure. The rectal mucosa was not inflamed. No neoplasia or other  pathology was identified. The inspection was well tolerated.  Extremities: no clubbing, cyanosis, or edema Neurological: Alert and oriented to person place and time. Skin: Skin is warm and dry.  Psychiatric: Normal mood and affect. Behavior is normal.  RELEVANT LABS AND IMAGING: CBC    Component Value Date/Time   WBC 6.5 10/02/2013 0940   RBC 5.04 10/02/2013 0940   HGB 15.7 10/02/2013 0940   HCT 44.9 10/02/2013 0940   PLT 197 10/02/2013 0940   MCV 89.1 10/02/2013 0940   MCH 31.2 10/02/2013 0940   MCHC 35.0 10/02/2013 0940   RDW 12.8 10/02/2013 0940    CMP     Component Value Date/Time   NA 142 10/02/2013 0940   K 4.7 10/02/2013 0940   CL 103 10/02/2013 0940   CO2 29 10/02/2013 0940   GLUCOSE 136 (H) 10/02/2013 0940   BUN 19 10/02/2013 0940   CREATININE 1.03 10/02/2013 0940   CALCIUM 9.5 10/02/2013 0940   GFRNONAA >90 10/02/2013 0940   GFRAA >90 10/02/2013 0940    ASSESSMENT/PLAN: 32 year old male with little past medical history who was seen in consultation at the request of Dr. Patsy Lageropland for evaluation of symptomatic hemorrhoids.   1.  Internal and external hemorrhoids --he has symptoms of symptomatic internal hemorrhoids and most recently the painful swelling was likely a thrombosed external hemorrhoid which is now resolving.  His perianal pain has resolved.  We discussed hemorrhoidal symptoms and management at length today.  We also discussed banding of internal hemorrhoids which would likely improve many of his symptoms and lessen his external symptoms.  For now we will manage conservatively but banding remains an option in the future if his symptoms are occurring regularly.  Also he was advised that if external symptom returns he should contact me and he may need an incision and drainage to lessen the severity of his external hemorrhoid.  For now I have recommended the following: --Hydrocortisone suppository 25 mg nightly times 3-5 days; this is short-term therapy  only --Nystatin ointment 3 times daily as needed for perianal rash (the burning perianal redness that he experiences is likely fungal in nature) --Creamy Desitin after bathing and after bowel movement as needed for a barrier skin protection and the zinc will also be antifungal --Contact me if ongoing hemorrhoidal symptoms to consider banding.  He is happy with this plan   UJ:[id number]:Copland, FredericSpencer, Md [address] [place], [place] [address]

## 2017-10-29 NOTE — Patient Instructions (Signed)
Please purchase the following medications over the counter and take as directed: Creamy Desitin-Use as a barrier cream for itching as needed and after showering  We have sent the following medications to your pharmacy for you to pick up at your convenience: Nystatin three times daily as needed Hydrocortisone Suppositories x 3-5 nights as needed for hemorrhoidal symptomsa  Call our office if you have repeated problems with rectal itching/discomfort. We may need to consider hemorrhoidal banding.  If you are age 33 or older, your body mass index should be between 23-30. Your Body mass index is 23.78 kg/m. If this is out of the aforementioned range listed, please consider follow up with your Primary Care Provider.  If you are age 33 or younger, your body mass index should be between 19-25. Your Body mass index is 23.78 kg/m. If this is out of the aformentioned range listed, please consider follow up with your Primary Care Provider.

## 2017-11-12 DIAGNOSIS — Z3189 Encounter for other procreative management: Secondary | ICD-10-CM | POA: Diagnosis not present

## 2017-11-23 ENCOUNTER — Ambulatory Visit: Payer: BLUE CROSS/BLUE SHIELD | Admitting: Pulmonary Disease

## 2017-11-23 ENCOUNTER — Encounter: Payer: Self-pay | Admitting: Pulmonary Disease

## 2017-11-23 VITALS — BP 112/70 | HR 74 | Ht 69.0 in | Wt 160.0 lb

## 2017-11-23 DIAGNOSIS — G4733 Obstructive sleep apnea (adult) (pediatric): Secondary | ICD-10-CM | POA: Diagnosis not present

## 2017-11-23 DIAGNOSIS — R0683 Snoring: Secondary | ICD-10-CM

## 2017-11-23 NOTE — Patient Instructions (Signed)
Home sleep study will be scheduled 

## 2017-11-23 NOTE — Progress Notes (Signed)
Subjective:    Patient ID: Tyler Simon, male    DOB: Jun 02, 1986, 32 y.o.   MRN: [id number]  HPI   Chief Complaint  Patient presents with  . Sleep Consult    Referred by Dr. Karleen Hampshire Copland for possible OSA. Per patient, he has a strong family history of OSA. He snores at night. Wakes up tired in the mornings regardless of how many hours he has slept. Denies ever having a sleep study before.    32 year old man of [place] origin presents for evaluation of sleep disordered breathing.  In spite of adequate sleep quantity. He feels that he has been retired over the last year.  Sometimes when he wakes up in the morning he feels like he is missing air and has to take a deep breath.  This also happens in the daytime on occasion when he has to take a deep breath when he is in the middle of his job. Wife has noted recent onset snoring but not witnessed apneas.  His wife sleeps deeply and does not even wake up when his alarm goes off. Epworth sleepiness score is 2. Bedtime is between 10:50 PM, sleep latency is between 10-30 minutes, he sleeps on his side with one pillow, reports 1-2 nocturnal awakenings including nocturia and is out of bed by 6:15 AM feeling tired but then occasional headache and dryness of mouth.  Keeps a bottle of water by his bedside. He has gained about 20 pounds in the last 3 years There is no history suggestive of cataplexy, sleep paralysis or parasomnias   He reports cardiac arrhythmias as a child but was never on medication. He has recently quit smoking, he smoked about 2-3 cigarettes a day and started on P90 X program to lose weight and get back in shape.    Past Medical History:  Diagnosis Date  . Allergic rhinitis due to pollen 07/25/2013  . Complication of anesthesia    woke up during surgery with wisdom tooth  . External hemorrhoid   . Pipe smoker 07/25/2013   Past Surgical History:  Procedure Laterality Date  . APPENDECTOMY  1988  . INGUINAL HERNIA REPAIR  Right 10/09/2013   Procedure: Right Groin Exploration;  Surgeon: [name]. Corliss Skains, MD;  Location: [facility] OR;  Service: General;  Laterality: Right;  . WISDOM TOOTH EXTRACTION       No Known Allergies  Social History   Socioeconomic History  . Marital status: Married    Spouse name: Bo Merino  . Number of children: 0  . Years of education: Not on file  . Highest education level: Not on file  Social Needs  . Financial resource strain: Not on file  . Food insecurity - worry: Not on file  . Food insecurity - inability: Not on file  . Transportation needs - medical: Not on file  . Transportation needs - non-medical: Not on file  Occupational History  . Occupation: Art gallery manager    Comment: [employer]  Tobacco Use  . Smoking status: Current Some Day Smoker    Types: Pipe, Cigarettes  . Smokeless tobacco: Never Used  Substance and Sexual Activity  . Alcohol use: Yes    Alcohol/week: 0.0 oz    Comment: social  . Drug use: No  . Sexual activity: Yes    Partners: Female  Other Topics Concern  . Not on file  Social History Narrative   Married to patient Production manager at [employer]   From [place], [place]     Family  History  Problem Relation Age of Onset  . Alzheimer's disease Father   . Colon cancer Neg Hx   . Stomach cancer Neg Hx       Review of Systems Constitutional: negative for anorexia, fevers and sweats  Eyes: negative for irritation, redness and visual disturbance  Ears, nose, mouth, throat, and face: negative for earaches, epistaxis, nasal congestion and sore throat  Respiratory: negative for cough, dyspnea on exertion, sputum and wheezing  Cardiovascular: negative for chest pain, dyspnea, lower extremity edema, orthopnea, palpitations and syncope  Gastrointestinal: negative for abdominal pain, constipation, diarrhea, melena, nausea and vomiting  Genitourinary:negative for dysuria, frequency and hematuria  Hematologic/lymphatic: negative for bleeding, easy bruising and  lymphadenopathy  Musculoskeletal:negative for arthralgias, muscle weakness and stiff joints  Neurological: negative for coordination problems, gait problems, headaches and weakness  Endocrine: negative for diabetic symptoms including polydipsia, polyuria and weight loss     Objective:   Physical Exam  Gen. Pleasant, well-nourished, in no distress, normal affect ENT - long uvula, no post nasal drip, congested turbinates, rt nostril narrowed down Neck: No JVD, no thyromegaly, no carotid bruits Lungs: no use of accessory muscles, no dullness to percussion, clear without rales or rhonchi  Cardiovascular: Rhythm regular, heart sounds  normal, no murmurs or gallops, no peripheral edema Abdomen: soft and non-tender, no hepatosplenomegaly, BS normal. Musculoskeletal: No deformities, no cyanosis or clubbing Neuro:  alert, non focal       Assessment & Plan:

## 2017-11-23 NOTE — Assessment & Plan Note (Signed)
Given excessive daytime fatigue, narrow pharyngeal exam,  & loud snoring, obstructive sleep apnea is very possible & an overnight polysomnogram will be scheduled as a home study. The pathophysiology of obstructive sleep apnea , it's cardiovascular consequences & modes of treatment including CPAP were discused with the patient in detail & they evidenced understanding.  Pretest probability is low.  If he is only a simple snorer may suggest nasal sprays to decrease especially in light nostril appears congested and blocked

## 2017-12-05 IMAGING — DX DG FOOT COMPLETE 3+V*R*
3 series · 3 of 3 positions shown · non-contrast
Comparison: None.

CLINICAL DATA: Right foot pain after playing kickball yesterday

EXAM:
RIGHT FOOT COMPLETE - 3+ VIEW

[foot ap]
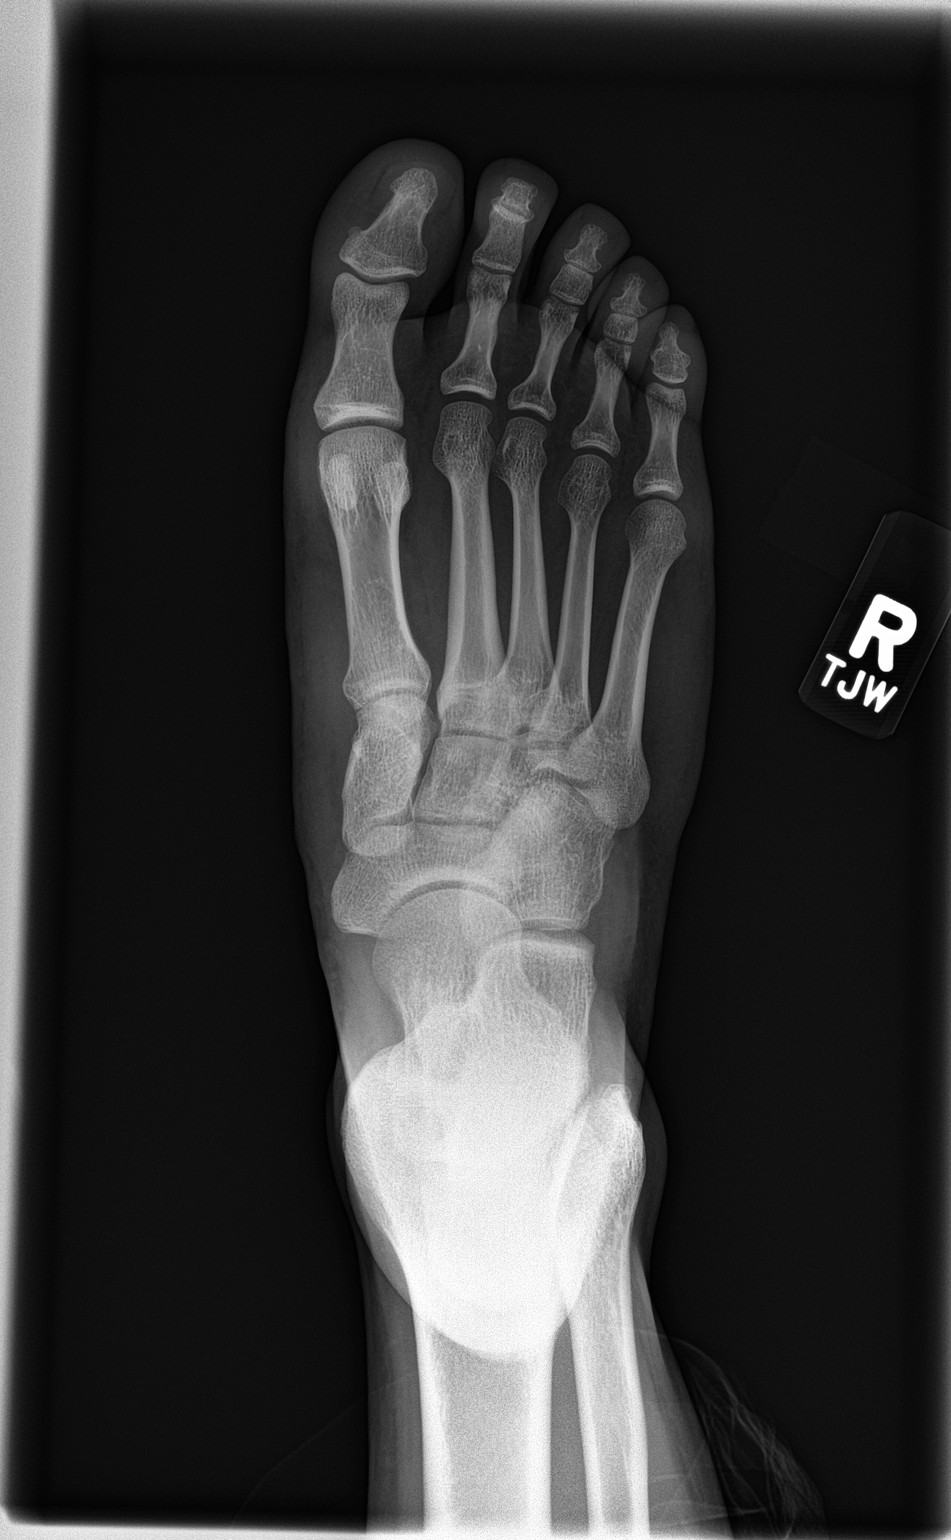

[foot obl]
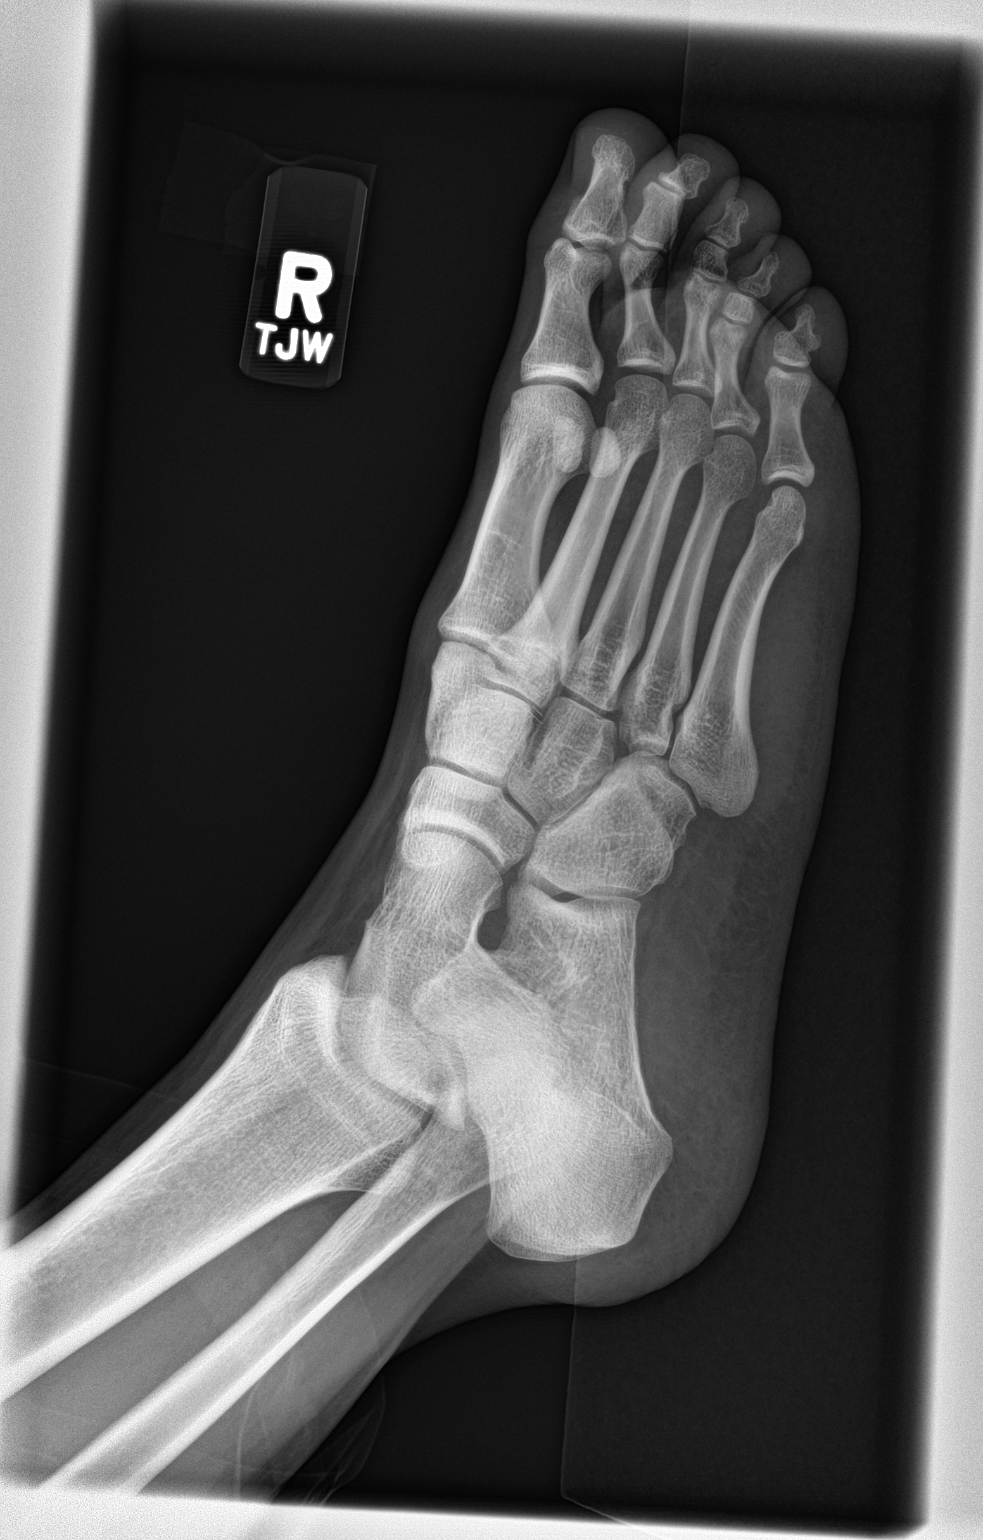

[foot lat]
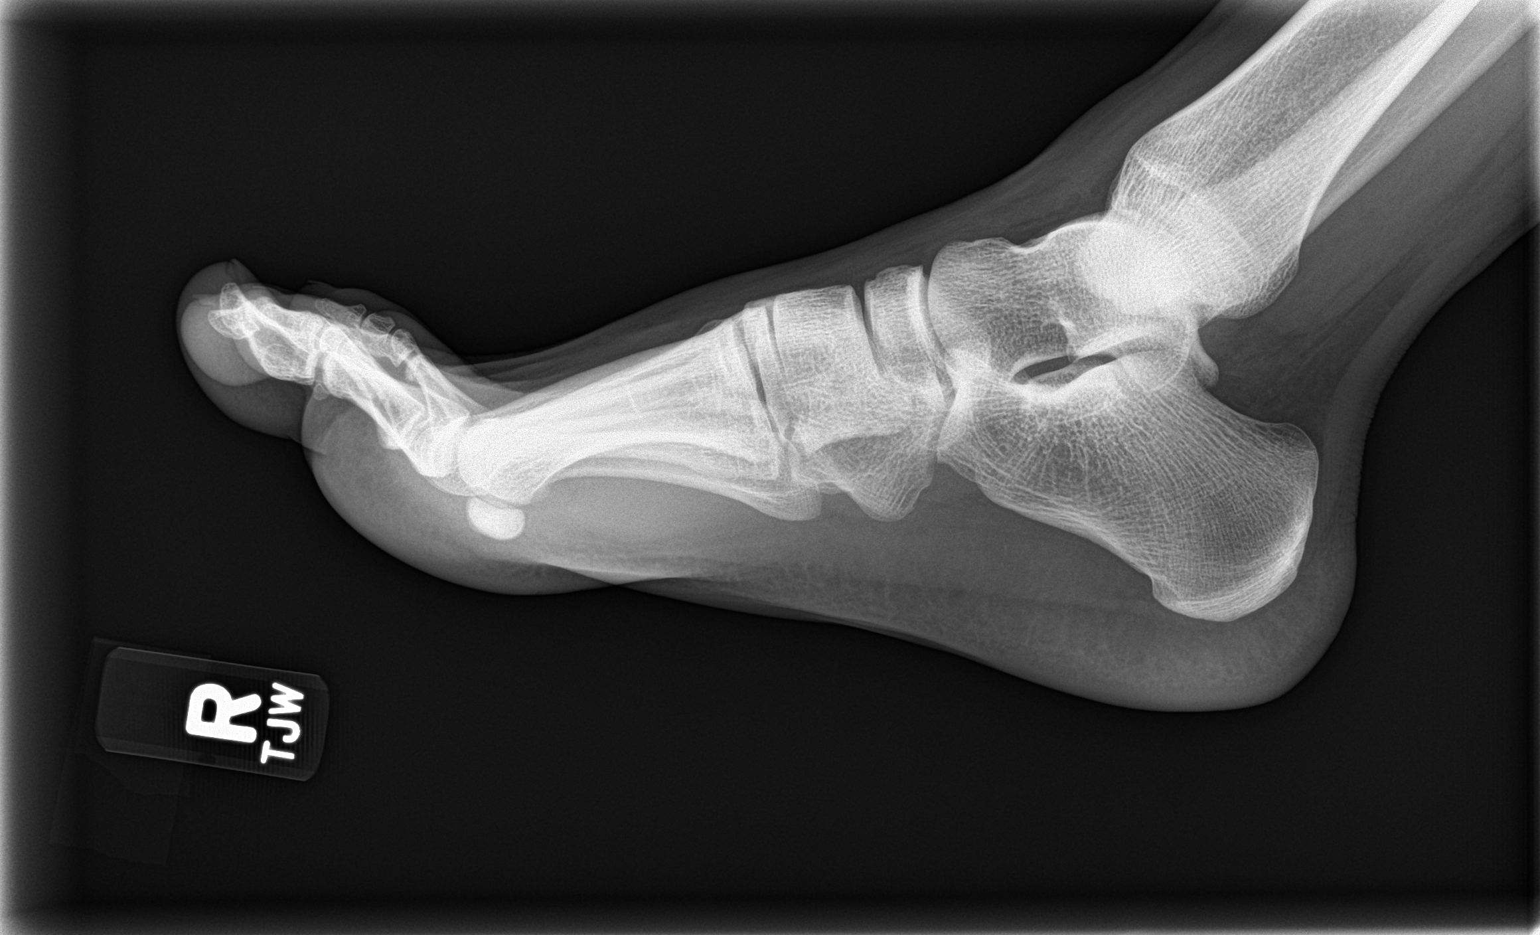

[3 of 3 positions shown; findings below may reference images not displayed]

FINDINGS: Tarsal-metatarsal alignment is normal. Joint spaces appear normal.
No fracture is seen.
IMPRESSION: Negative.

## 2017-12-18 DIAGNOSIS — G4733 Obstructive sleep apnea (adult) (pediatric): Secondary | ICD-10-CM | POA: Diagnosis not present

## 2017-12-21 ENCOUNTER — Other Ambulatory Visit: Payer: Self-pay | Admitting: *Deleted

## 2017-12-21 DIAGNOSIS — G4733 Obstructive sleep apnea (adult) (pediatric): Secondary | ICD-10-CM

## 2017-12-25 ENCOUNTER — Telehealth: Payer: Self-pay | Admitting: Pulmonary Disease

## 2017-12-25 DIAGNOSIS — G4733 Obstructive sleep apnea (adult) (pediatric): Secondary | ICD-10-CM | POA: Diagnosis not present

## 2017-12-25 NOTE — Telephone Encounter (Signed)
Per RA, HST showed mild OSA. It is increased when lying in the supine position.   Needs OV to discuss options.

## 2017-12-25 NOTE — Telephone Encounter (Signed)
Spoke with patient. He is aware of results. He wishes to schedule an appt with RA to discuss options. Will place a reminder on patient's chart to get him scheduled when RA opens up clinic next week.   Nothing else needed at time of call.

## 2017-12-26 ENCOUNTER — Ambulatory Visit (INDEPENDENT_AMBULATORY_CARE_PROVIDER_SITE_OTHER)
Admission: RE | Admit: 2017-12-26 | Discharge: 2017-12-26 | Disposition: A | Discharge status: Home / Self Care | Payer: BLUE CROSS/BLUE SHIELD | Source: Ambulatory Visit | Attending: Family Medicine | Admitting: Family Medicine

## 2017-12-26 ENCOUNTER — Ambulatory Visit: Payer: BLUE CROSS/BLUE SHIELD | Admitting: Family Medicine

## 2017-12-26 ENCOUNTER — Encounter: Payer: Self-pay | Admitting: Family Medicine

## 2017-12-26 ENCOUNTER — Other Ambulatory Visit: Payer: Self-pay

## 2017-12-26 VITALS — BP 100/64 | HR 51 | Temp 98.7°F | Ht 69.0 in | Wt 161.8 lb

## 2017-12-26 DIAGNOSIS — Z23 Encounter for immunization: Secondary | ICD-10-CM | POA: Diagnosis not present

## 2017-12-26 DIAGNOSIS — S81031A Puncture wound without foreign body, right knee, initial encounter: Secondary | ICD-10-CM

## 2017-12-26 MED ORDER — AMOXICILLIN-POT CLAVULANATE 875-125 MG PO TABS: 1.0000 | ORAL_TABLET | Freq: Two times a day (BID) | ORAL | 0 refills | Status: DC

## 2017-12-26 NOTE — Progress Notes (Signed)
Dr. Karleen HampshireSpencer T. Arienne Gartin, MD, CAQ Sports Medicine [facility] Sports [facility] [address] [place], [id number] Phone: [phone] Fax: [phone]  12/26/2017  Patient: Tyler Simon, MRN: [id number], DOB: 08/08/1986, 32 y.o.  Primary Physician:  Hannah Beatopland, Laasya Peyton, MD   Chief Complaint  Patient presents with  . Puncture Wound    Right Knee   Subjective:   Tyler Simon is a 32 y.o. very pleasant male patient who presents with the following:  12/25/2017 DOI  Very nice gentleman who I remember quite well who presents with a puncture wound on his right knee via nail.  He also is having significant pain around the patella and around the entry wound.  It is currently not bleeding.  There is some tenderness surrounding this area without dramatic bruising.  He is able to walk without difficulty.  augmentin  Past Medical History, Surgical History, Social History, Family History, Problem List, Medications, and Allergies have been reviewed and updated if relevant.  Patient Active Problem List   Diagnosis Date Noted  . Snoring 11/23/2017  . Recurrent cold sores 04/24/2017  . Metatarsalgia of both feet 02/09/2017  . Vas deferens stricture 10/17/2013  . Allergic rhinitis due to pollen 07/25/2013  . Pipe smoker 07/25/2013    Past Medical History:  Diagnosis Date  . Allergic rhinitis due to pollen 07/25/2013  . Complication of anesthesia    woke up during surgery with wisdom tooth  . External hemorrhoid   . Pipe smoker 07/25/2013    Past Surgical History:  Procedure Laterality Date  . APPENDECTOMY  1988  . INGUINAL HERNIA REPAIR Right 10/09/2013   Procedure: Right Groin Exploration;  Surgeon: Wilmon ArmsMatthew K. Corliss Skainssuei, MD;  Location: [facility] OR;  Service: General;  Laterality: Right;  . WISDOM TOOTH EXTRACTION      Social History   Socioeconomic History  . Marital status: Married    Spouse name: Bo MerinoGeri  . Number of children: 0  . Years of education: Not on file  . Highest  education level: Not on file  Occupational History  . Occupation: Art gallery managerngineer    Comment: [employer]  Social Needs  . Financial resource strain: Not on file  . Food insecurity:    Worry: Not on file    Inability: Not on file  . Transportation needs:    Medical: Not on file    Non-medical: Not on file  Tobacco Use  . Smoking status: Former Smoker    Types: Pipe, Cigarettes  . Smokeless tobacco: Never Used  Substance and Sexual Activity  . Alcohol use: Yes    Alcohol/week: 0.0 oz    Comment: social  . Drug use: No  . Sexual activity: Yes    Partners: Female  Lifestyle  . Physical activity:    Days per week: Not on file    Minutes per session: Not on file  . Stress: Not on file  Relationships  . Social connections:    Talks on phone: Not on file    Gets together: Not on file    Attends religious service: Not on file    Active member of club or organization: Not on file    Attends meetings of clubs or organizations: Not on file    Relationship status: Not on file  . Intimate partner violence:    Fear of current or ex partner: Not on file    Emotionally abused: Not on file    Physically abused: Not on file    Forced sexual  activity: Not on file  Other Topics Concern  . Not on file  Social History Narrative   Married to patient Production manager at [employer]   From [place], [place]    Family History  Problem Relation Age of Onset  . Alzheimer's disease Father   . Colon cancer Neg Hx   . Stomach cancer Neg Hx     No Known Allergies  Medication list reviewed and updated in full in [facility].  ROS: GEN: Acute illness details above GI: Tolerating PO intake GU: maintaining adequate hydration and urination Pulm: No SOB Interactive and getting along well at home.  Otherwise, ROS is as per the HPI.  Objective:   BP 100/64   Pulse (!) 51   Temp 98.7 F (37.1 C) (Oral)   Ht 5\' 9"  (1.753 m)   Wt 161 lb 12 oz (73.4 kg)   BMI 23.89 kg/m    GEN: WDWN,  NAD, Non-toxic, Alert & Oriented x 3 HEENT: Atraumatic, Normocephalic.  Ears and Nose: No external deformity. EXTR: No clubbing/cyanosis/edema NEURO: Normal gait.  PSYCH: Normally interactive. Conversant. Not depressed or anxious appearing.  Calm demeanor.    Right knee has full extension and flexion 230 degrees.  There is no significant effusion.  There is a puncture mark that is relatively small, no laceration.  Surrounding this approximately the size of a nickel there is some tenderness without bruising.  There is some tenderness also with direct palpation of the patella itself laterally.  Movement is good.  MCL and LCL are stable as well as ACL and PCL.  Flexion pinch and McMurray's are also normal.   Laboratory and Imaging Data: Dg Knee Complete 4 Views Right  Result Date: 12/26/2017 CLINICAL DATA:  Puncture wound of right knee, initial encounter. Nail through the skin to knee cap. EXAM: RIGHT KNEE - COMPLETE 4+ VIEW COMPARISON:  None. FINDINGS: No evidence of fracture, dislocation, or joint effusion. No evidence of arthropathy or other focal bone abnormality. No radiopaque foreign body or tracking soft tissue air. Left knee obtained on AP standing views is unremarkable. IMPRESSION: Negative radiographs of the right knee. No radiopaque foreign body or osseous abnormality. Electronically Signed   By: Rubye Oaks M.D.   On: 12/26/2017 23:05    Assessment and Plan:   Puncture wound of right knee, initial encounter - Plan: Tdap vaccine greater than or equal to 7yo IM, DG Knee Complete 4 Views Right  Need for prophylactic vaccination with combined diphtheria-tetanus-pertussis (DTP) vaccine - Plan: Tdap vaccine greater than or equal to 7yo IM  No evidence of fracture, and he is likely going to have some soft tissue contusion as well as bone contusion for the next 2-3 weeks.  Given penetrating wound with the nail, I am going to place him on some antibiotics as well.  Follow-up: No  follow-ups on file.  Meds ordered this encounter  Medications  . amoxicillin-clavulanate (AUGMENTIN) 875-125 MG tablet    Sig: Take 1 tablet by mouth 2 (two) times daily for 7 days.    Dispense:  14 tablet    Refill:  0   Orders Placed This Encounter  Procedures  . DG Knee Complete 4 Views Right  . Tdap vaccine greater than or equal to 7yo IM    Signed,  Forrest Jaroszewski T. Chanceler Pullin, MD   Allergies as of 12/26/2017   No Known Allergies     Medication List        Accurate as of 12/26/17  11:59 PM. Always use your most recent med list.          amoxicillin-clavulanate 875-125 MG tablet Commonly known as:  AUGMENTIN Take 1 tablet by mouth 2 (two) times daily for 7 days.   hydrocortisone 25 MG suppository Commonly known as:  ANUSOL-HC Place 1 suppository (25 mg total) rectally at bedtime. x3-5 days   nystatin cream Commonly known as:  MYCOSTATIN Apply 1 application topically 3 (three) times daily as needed.

## 2017-12-28 ENCOUNTER — Telehealth: Payer: Self-pay | Admitting: Pulmonary Disease

## 2017-12-28 NOTE — Telephone Encounter (Signed)
Left message for patient to call back. He wanted to see RA to discuss his HST results. Patient can be scheduled in one of the held spots for RAs pts.

## 2018-01-02 ENCOUNTER — Encounter: Payer: Self-pay | Admitting: Pulmonary Disease

## 2018-01-02 ENCOUNTER — Ambulatory Visit: Payer: BLUE CROSS/BLUE SHIELD | Admitting: Pulmonary Disease

## 2018-01-02 DIAGNOSIS — G4733 Obstructive sleep apnea (adult) (pediatric): Secondary | ICD-10-CM | POA: Diagnosis not present

## 2018-01-02 NOTE — Progress Notes (Signed)
   Subjective:    Patient ID: Tyler JuniorMatthieu Simon, male    DOB: 12/24/1985, 32 y.o.   MRN: [id number]  HPI  32 year old man with snoring, excessive daytime tiredness and non-refreshing sleep  He  recently quit smoking, he smoked about 2-3 cigarettes a day and started on P90 X program to lose weight and get back in shape.  We discussed home study results in detail today. This showed mild OSA, average 6/hour, but events were mostly only noted when he was on his back.  No significant events on his right or left side.  Events lasted about 20 seconds and an average with mild desaturations.  About 8 hours of recording time  Review of Systems Patient denies significant dyspnea,cough, hemoptysis,  chest pain, palpitations, pedal edema, orthopnea, paroxysmal nocturnal dyspnea, lightheadedness, nausea, vomiting, abdominal or  leg pains      Objective:   Physical Exam Gen. Pleasant, well-nourished, in no distress ENT -overbite, no post nasal drip Neck: No JVD, no thyromegaly, no carotid bruits Lungs: no use of accessory muscles, no dullness to percussion, clear without rales or rhonchi  Cardiovascular: Rhythm regular, heart sounds  normal, no murmurs or gallops, no peripheral edema Musculoskeletal: No deformities, no cyanosis or clubbing         Assessment & Plan:

## 2018-01-02 NOTE — Assessment & Plan Note (Signed)
mild obstructive sleep apnea, especially when On your back. We discussed maneuvers to avoid sleeping on your back. Checkout  [email or web address]   if no better in 6 months call me back and we can refer you to dentist for Mouthguard

## 2018-01-02 NOTE — Patient Instructions (Addendum)
You have mild obstructive sleep apnea, especially when On your back. We discussed maneuvers to avoid sleeping on your back. Checkout  [email or web address]   if no better in 6 months call me back and we can refer you to dentist for Mouthguard

## 2018-01-14 ENCOUNTER — Telehealth: Payer: Self-pay | Admitting: Internal Medicine

## 2018-01-14 NOTE — Telephone Encounter (Signed)
See comment below from Dr. Rhea Belton regarding scheduling.

## 2018-01-14 NOTE — Telephone Encounter (Signed)
Pt is requesting a sooner appt for banding. First available is in July and he stated that he cannot wait that long. Please call him.

## 2018-01-14 NOTE — Telephone Encounter (Signed)
Patient states he is still having problems with his hemorrhoids from ov on 2.11.19 and wants to know if he can schedule a hem banding like discussed. Please advise

## 2018-01-14 NOTE — Telephone Encounter (Signed)
Dottie Can you look over my clinic schedule and select a day when we might be able to add an additional banding spot such as a day when complexity of patients is relatively low

## 2018-01-14 NOTE — Telephone Encounter (Signed)
Ok for banding appts  Would schedule at least 2 of these appt, but he will likely need 3 sessions

## 2018-01-14 NOTE — Telephone Encounter (Signed)
Pt calling stating his hemorrhoids are still causing him problems. Pt would like to have banding done. Please advise if ok to schedule for banding.

## 2018-01-14 NOTE — Telephone Encounter (Signed)
Pt states he cannot wait until July for the available banding appt. Do you want to refer him to another provider or do you want to work him in sooner? Please advise.

## 2018-01-15 ENCOUNTER — Encounter: Payer: Self-pay | Admitting: *Deleted

## 2018-01-15 NOTE — Telephone Encounter (Signed)
Patient has been scheduled for possible banding tomorrow, 01/16/18 at 330 pm.

## 2018-01-16 ENCOUNTER — Encounter: Payer: Self-pay | Admitting: Internal Medicine

## 2018-01-16 ENCOUNTER — Ambulatory Visit: Payer: BLUE CROSS/BLUE SHIELD | Admitting: Internal Medicine

## 2018-01-16 VITALS — BP 104/68 | HR 60 | Ht 69.0 in | Wt 157.0 lb

## 2018-01-16 DIAGNOSIS — K648 Other hemorrhoids: Secondary | ICD-10-CM | POA: Diagnosis not present

## 2018-01-16 NOTE — Patient Instructions (Addendum)
You have been scheduled for hemorrhoidal banding #2 on 02/12/18 at 10:45 am.  You are scheduled for your 3rd hemorrhoidal banding on 03/25/18 at 330 pm.  HEMORRHOID BANDING PROCEDURE    FOLLOW-UP CARE   1. The procedure you have had should have been relatively painless since the banding of the area involved does not have nerve endings and there is no pain sensation.  The rubber band cuts off the blood supply to the hemorrhoid and the band may fall off as soon as 48 hours after the banding (the band may occasionally be seen in the toilet bowl following a bowel movement). You may notice a temporary feeling of fullness in the rectum which should respond adequately to plain Tylenol or Motrin.  2. Following the banding, avoid strenuous exercise that evening and resume full activity the next day.  A sitz bath (soaking in a warm tub) or bidet is soothing, and can be useful for cleansing the area after bowel movements.     3. To avoid constipation, take two tablespoons of natural wheat bran, natural oat bran, flax, Benefiber or any over the counter fiber supplement and increase your water intake to 7-8 glasses daily.    4. Unless you have been prescribed anorectal medication, do not put anything inside your rectum for two weeks: No suppositories, enemas, fingers, etc.  5. Occasionally, you may have more bleeding than usual after the banding procedure.  This is often from the untreated hemorrhoids rather than the treated one.  Don't be concerned if there is a tablespoon or so of blood.  If there is more blood than this, lie flat with your bottom higher than your head and apply an ice pack to the area. If the bleeding does not stop within a half an hour or if you feel faint, call our office at [phone] or go to the emergency room.  6. Problems are not common; however, if there is a substantial amount of bleeding, severe pain, chills, fever or difficulty passing urine (very rare) or other problems,  you should call us at [phone] or report to the nearest emergency room.  7. Do not stay seated continuously for more than 2-3 hours for a day or two after the procedure.  Tighten your buttock muscles 10-15 times every two hours and take 10-15 deep breaths every 1-2 hours.  Do not spend more than a few minutes on the toilet if you cannot empty your bowel; instead re-visit the toilet at a later time.    If you are age 35 or older, your body mass index should be between 23-30. Your Body mass index is 23.18 kg/m. If this is out of the aforementioned range listed, please consider follow up with your Primary Care Provider.  If you are age 70 or younger, your body mass index should be between 19-25. Your Body mass index is 23.18 kg/m. If this is out of the aformentioned range listed, please consider follow up with your Primary Care Provider.

## 2018-01-17 NOTE — Progress Notes (Signed)
32 year old male with history of symptomatic hemorrhoids, internal and external who presents for hemorrhoidal banding He was seen and examined on 10/29/2017 Medical treatment with hydrocortisone suppositories helped but his symptoms recur.  His biggest symptom is prolapse and bulging of hemorrhoids along with perianal irritation, anal itching and anal discomfort. He does not see rectal bleeding   PROCEDURE NOTE:  The patient presents with symptomatic grade 2 internal hemorrhoids, requesting rubber band ligation of his hemorrhoidal disease.  All risks, benefits and alternative forms of therapy were described and informed consent was obtained.   The anorectum was pre-medicated with 0.125% nitroglycerin ointment The decision was made to band the LL internal hemorrhoid, and the [facility] O'Regan System was used to perform band ligation without complication.   Digital anorectal examination was then performed to assure proper positioning of the band, and to adjust the banded tissue as required.  The patient was discharged home without pain or other issues.  Dietary and behavioral recommendations were given and along with follow-up instructions.      The patient will return as scheduled for follow-up and possible additional banding as required. No complications were encountered and the patient tolerated the procedure well.

## 2018-02-12 ENCOUNTER — Encounter: Payer: Self-pay | Admitting: Internal Medicine

## 2018-02-12 ENCOUNTER — Ambulatory Visit: Payer: BLUE CROSS/BLUE SHIELD | Admitting: Internal Medicine

## 2018-02-12 VITALS — BP 100/60 | HR 60 | Ht 69.0 in | Wt 158.4 lb

## 2018-02-12 DIAGNOSIS — K648 Other hemorrhoids: Secondary | ICD-10-CM | POA: Diagnosis not present

## 2018-02-12 NOTE — Patient Instructions (Signed)
You have been scheduled for your 3rd banding on 04/15/18 at 3:45 pm.  HEMORRHOID BANDING PROCEDURE    FOLLOW-UP CARE   1. The procedure you have had should have been relatively painless since the banding of the area involved does not have nerve endings and there is no pain sensation.  The rubber band cuts off the blood supply to the hemorrhoid and the band may fall off as soon as 48 hours after the banding (the band may occasionally be seen in the toilet bowl following a bowel movement). You may notice a temporary feeling of fullness in the rectum which should respond adequately to plain Tylenol or Motrin.  2. Following the banding, avoid strenuous exercise that evening and resume full activity the next day.  A sitz bath (soaking in a warm tub) or bidet is soothing, and can be useful for cleansing the area after bowel movements.     3. To avoid constipation, take two tablespoons of natural wheat bran, natural oat bran, flax, Benefiber or any over the counter fiber supplement and increase your water intake to 7-8 glasses daily.    4. Unless you have been prescribed anorectal medication, do not put anything inside your rectum for two weeks: No suppositories, enemas, fingers, etc.  5. Occasionally, you may have more bleeding than usual after the banding procedure.  This is often from the untreated hemorrhoids rather than the treated one.  Don't be concerned if there is a tablespoon or so of blood.  If there is more blood than this, lie flat with your bottom higher than your head and apply an ice pack to the area. If the bleeding does not stop within a half an hour or if you feel faint, call our office at [phone] or go to the emergency room.  6. Problems are not common; however, if there is a substantial amount of bleeding, severe pain, chills, fever or difficulty passing urine (very rare) or other problems, you should call us at [phone] or report to the nearest emergency  room.  7. Do not stay seated continuously for more than 2-3 hours for a day or two after the procedure.  Tighten your buttock muscles 10-15 times every two hours and take 10-15 deep breaths every 1-2 hours.  Do not spend more than a few minutes on the toilet if you cannot empty your bowel; instead re-visit the toilet at a later time.

## 2018-02-12 NOTE — Progress Notes (Signed)
Tyler Simon is a 32 year old male with a history of symptomatic hemorrhoids, internal and external who is here for additional hemorrhoidal banding He had his initial hemorrhoidal banding of 01/16/2018 to the left lateral internal hemorrhoid.  He tolerated this well. Symptoms prior to banding included hemorrhoidal prolapse, perianal irritation, anal itching and anal discomfort  He tolerated the first banding well.  He has had a very mild "tickle" type sensation after the banding but this has been minor.  No further issues with hemorrhoidal prolapse/bulging or discomfort.   PROCEDURE NOTE:  The patient presents with symptomatic grade 2 internal hemorrhoids, requesting rubber band ligation of his hemorrhoidal disease.  All risks, benefits and alternative forms of therapy were described and informed consent was obtained.   The anorectum was pre-medicated with 0.125% nitroglycerin ointment The decision was made to band the RA (LL on 01/16/2018) internal hemorrhoid, and the [facility] O'Regan System was used to perform band ligation without complication.   Digital anorectal examination was then performed to assure proper positioning of the band, and to adjust the banded tissue as required.  The patient was discharged home without pain or other issues.  Dietary and behavioral recommendations were given and along with follow-up instructions.     The patient will return as scheduled for follow-up and possible additional banding as required. At follow-up consider repeat endoscopy before deciding if third band is needed.  No complications were encountered and the patient tolerated the procedure well.

## 2018-03-25 ENCOUNTER — Encounter: Payer: BLUE CROSS/BLUE SHIELD | Admitting: Internal Medicine

## 2018-04-15 ENCOUNTER — Ambulatory Visit: Payer: BLUE CROSS/BLUE SHIELD | Admitting: Internal Medicine

## 2018-04-15 VITALS — BP 124/68 | HR 78 | Ht 69.0 in | Wt 162.0 lb

## 2018-04-15 DIAGNOSIS — L290 Pruritus ani: Secondary | ICD-10-CM | POA: Diagnosis not present

## 2018-04-15 DIAGNOSIS — K648 Other hemorrhoids: Secondary | ICD-10-CM | POA: Diagnosis not present

## 2018-04-15 NOTE — Progress Notes (Signed)
Tyler MellowMatt Simon is a 32 year old male with a history of symptomatic internal and external hemorrhoids who is here for additional hemorrhoidal banding He had hemorrhoidal banding on 01/16/2018 and 02/12/2018.  He tolerated these well. Symptoms prior to banding included hemorrhoidal prolapse, perianal irritation, anal itching and anal discomfort  After 2 banding's he has had only very minor perianal itching on 2 occasions.  No further prolapse symptoms or perianal pain.  He is very happy with result to this point  PROCEDURE NOTE: The patient presents with symptomatic grade 2 internal hemorrhoids, requesting rubber band ligation of his hemorrhoidal disease.  All risks, benefits and alternative forms of therapy were described and informed consent was obtained.  In the Left Lateral Decubitus position anoscopic examination revealed much improved hemorrhoids particularly left lateral and right anterior.  Small internal hemorrhoid right posterior.   The anorectum was pre-medicated with 0.125% nitroglycerin ointment The decision was made to band the right posterior (LL on 01/16/2018 and right anterior on 02/12/2018) internal hemorrhoid, and the [facility] O'Regan System was used to perform band ligation without complication.  Digital anorectal examination was then performed to assure proper positioning of the band, and to adjust the banded tissue as required.  The patient was discharged home without pain or other issues.  Dietary and behavioral recommendations were given and along with follow-up instructions.      The patient will return as needed for follow-up and possible additional banding as required. No complications were encountered and the patient tolerated the procedure well.

## 2018-04-15 NOTE — Patient Instructions (Signed)
If you are age 33 or older, your body mass index should be between 23-30. Your Body mass index is 23.92 kg/m. If this is out of the aforementioned range listed, please consider follow up with your Primary Care Provider.  If you are age 33 or younger, your body mass index should be between 19-25. Your Body mass index is 23.92 kg/m. If this is out of the aformentioned range listed, please consider follow up with your Primary Care Provider.   HEMORRHOID BANDING PROCEDURE    FOLLOW-UP CARE   1. The procedure you have had should have been relatively painless since the banding of the area involved does not have nerve endings and there is no pain sensation.  The rubber band cuts off the blood supply to the hemorrhoid and the band may fall off as soon as 48 hours after the banding (the band may occasionally be seen in the toilet bowl following a bowel movement). You may notice a temporary feeling of fullness in the rectum which should respond adequately to plain Tylenol or Motrin.  2. Following the banding, avoid strenuous exercise that evening and resume full activity the next day.  A sitz bath (soaking in a warm tub) or bidet is soothing, and can be useful for cleansing the area after bowel movements.     3. To avoid constipation, take two tablespoons of natural wheat bran, natural oat bran, flax, Benefiber or any over the counter fiber supplement and increase your water intake to 7-8 glasses daily.    4. Unless you have been prescribed anorectal medication, do not put anything inside your rectum for two weeks: No suppositories, enemas, fingers, etc.  5. Occasionally, you may have more bleeding than usual after the banding procedure.  This is often from the untreated hemorrhoids rather than the treated one.  Don't be concerned if there is a tablespoon or so of blood.  If there is more blood than this, lie flat with your bottom higher than your head and apply an ice pack to the area. If the bleeding  does not stop within a half an hour or if you feel faint, call our office at [phone] or go to the emergency room.  6. Problems are not common; however, if there is a substantial amount of bleeding, severe pain, chills, fever or difficulty passing urine (very rare) or other problems, you should call us at [phone] or report to the nearest emergency room.  7. Do not stay seated continuously for more than 2-3 hours for a day or two after the procedure.  Tighten your buttock muscles 10-15 times every two hours and take 10-15 deep breaths every 1-2 hours.  Do not spend more than a few minutes on the toilet if you cannot empty your bowel; instead re-visit the toilet at a later time.    Please follow up with us as needed.  Thank you, Dr. Rhea BeltonPyrtle

## 2018-04-23 ENCOUNTER — Telehealth: Payer: Self-pay

## 2018-04-23 DIAGNOSIS — M79671 Pain in right foot: Secondary | ICD-10-CM

## 2018-04-23 DIAGNOSIS — M79672 Pain in left foot: Principal | ICD-10-CM

## 2018-04-23 NOTE — Telephone Encounter (Signed)
Copied from CRM [id number]. Topic: General - Other >> Apr 23, 2018  2:51 PM Elliot GaultBell, Tiffany M wrote: Relation to pt: self  Call back number: [phone]   Reason for call:  Patient requesting Podiatry referral, both feet are flat and hurt when walking, patient states he may need professional soles, please advise

## 2018-04-23 NOTE — Telephone Encounter (Signed)
Pt last seen acute visit on 12/26/17 and on 02/09/17 was seen by Dr Berline Choughigby for foot pain.

## 2018-04-24 NOTE — Telephone Encounter (Signed)
done

## 2018-04-27 ENCOUNTER — Ambulatory Visit: Payer: Self-pay

## 2018-04-27 ENCOUNTER — Other Ambulatory Visit: Payer: Self-pay | Admitting: Podiatry

## 2018-04-27 ENCOUNTER — Ambulatory Visit: Payer: BLUE CROSS/BLUE SHIELD | Admitting: Podiatry

## 2018-04-27 ENCOUNTER — Ambulatory Visit (INDEPENDENT_AMBULATORY_CARE_PROVIDER_SITE_OTHER): Payer: BLUE CROSS/BLUE SHIELD

## 2018-04-27 DIAGNOSIS — M722 Plantar fascial fibromatosis: Secondary | ICD-10-CM

## 2018-04-27 DIAGNOSIS — M2141 Flat foot [pes planus] (acquired), right foot: Secondary | ICD-10-CM

## 2018-04-27 DIAGNOSIS — M2142 Flat foot [pes planus] (acquired), left foot: Secondary | ICD-10-CM

## 2018-04-27 DIAGNOSIS — M779 Enthesopathy, unspecified: Secondary | ICD-10-CM

## 2018-04-27 DIAGNOSIS — M778 Other enthesopathies, not elsewhere classified: Secondary | ICD-10-CM

## 2018-04-27 MED ORDER — MELOXICAM 15 MG PO TABS: 15.0000 mg | ORAL_TABLET | Freq: Every day | ORAL | 1 refills | Status: AC

## 2018-04-30 NOTE — Progress Notes (Signed)
   Subjective: 32 year old male presenting today with a chief complaint of bilateral foot pain that began several months ago. He reports h/o flat feet and states his foot pain is becoming worse lately. Standing and walking for long periods of time increases the pain. He has not done anything for treatment. Patient is here for further evaluation and treatment.   Past Medical History:  Diagnosis Date  . Allergic rhinitis due to pollen 07/25/2013  . Complication of anesthesia    woke up during surgery with wisdom tooth  . External hemorrhoid   . Internal hemorrhoids   . Pipe smoker 07/25/2013     Objective: Physical Exam General: The patient is alert and oriented x3 in no acute distress.  Dermatology: Skin is warm, dry and supple bilateral lower extremities. Negative for open lesions or macerations bilateral.   Vascular: Dorsalis Pedis and Posterior Tibial pulses palpable bilateral.  Capillary fill time is immediate to all digits.  Neurological: Epicritic and protective threshold intact bilateral.   Musculoskeletal: Tenderness to palpation to the plantar aspect of the bilateral heels along the plantar fascia. All other joints range of motion within normal limits bilateral. Strength 5/5 in all groups bilateral.   Radiographic exam: Normal osseous mineralization. Joint spaces preserved. No fracture/dislocation/boney destruction. No other soft tissue abnormalities or radiopaque foreign bodies.   Assessment: 1. plantar fasciitis bilateral feet 2. Generalized foot pain bilateral   Plan of Care:  1. Patient evaluated. Xrays reviewed.   2. Prescription for Meloxicam provided to patient.  3. Appointment with Raiford Nobleick for custom molded orthotics.  4. Return to clinic as needed.   Felecia ShellingBrent M. Evans, DPM [facility]  Dr. Felecia ShellingBrent M. Evans, DPM    [address]                                   [place], [place] [id number]                Office [phone]  Fax [phone]  [phone]

## 2018-05-06 ENCOUNTER — Ambulatory Visit (INDEPENDENT_AMBULATORY_CARE_PROVIDER_SITE_OTHER): Payer: BLUE CROSS/BLUE SHIELD | Admitting: Orthotics

## 2018-05-06 DIAGNOSIS — M722 Plantar fascial fibromatosis: Secondary | ICD-10-CM

## 2018-05-06 NOTE — Progress Notes (Signed)

## 2018-05-27 ENCOUNTER — Ambulatory Visit: Payer: BLUE CROSS/BLUE SHIELD | Admitting: Orthotics

## 2018-05-27 DIAGNOSIS — M722 Plantar fascial fibromatosis: Secondary | ICD-10-CM

## 2018-05-27 DIAGNOSIS — M7741 Metatarsalgia, right foot: Secondary | ICD-10-CM

## 2018-05-27 DIAGNOSIS — M7742 Metatarsalgia, left foot: Secondary | ICD-10-CM

## 2018-05-27 NOTE — Progress Notes (Signed)
Patient came in today to pick up custom made foot orthotics.  The goals were accomplished and the patient reported no dissatisfaction with said orthotics.  Patient was advised of breakin period and how to report any issues. 

## 2018-08-07 ENCOUNTER — Encounter: Payer: Self-pay | Admitting: Sports Medicine

## 2018-08-07 ENCOUNTER — Ambulatory Visit: Payer: BLUE CROSS/BLUE SHIELD | Admitting: Sports Medicine

## 2018-08-07 VITALS — BP 106/68 | HR 58 | Ht 69.0 in | Wt 161.0 lb

## 2018-08-07 DIAGNOSIS — M19019 Primary osteoarthritis, unspecified shoulder: Secondary | ICD-10-CM | POA: Insufficient documentation

## 2018-08-07 DIAGNOSIS — M779 Enthesopathy, unspecified: Secondary | ICD-10-CM

## 2018-08-07 DIAGNOSIS — M778 Other enthesopathies, not elsewhere classified: Secondary | ICD-10-CM | POA: Insufficient documentation

## 2018-08-07 MED ORDER — DICLOFENAC SODIUM 2 % TD SOLN: 1.0000 "application " | Freq: Two times a day (BID) | TRANSDERMAL | 0 refills | Status: AC

## 2018-08-07 MED ORDER — DICLOFENAC SODIUM 2 % TD SOLN: 1.0000 "application " | Freq: Two times a day (BID) | TRANSDERMAL | 2 refills | Status: DC

## 2018-08-07 NOTE — Progress Notes (Signed)
Veverly Fells. Delorise Shiner Sports [facility] at [facility] [phone]  Mclean Kemppainen - 32 y.o. male MRN [id number]  Date of birth: 1986-03-17  Visit Date: 08/07/2018  PCP: Hannah Beat, MD   Referred by: Hannah Beat, MD   Scribe(s) for today's visit: Christoper Fabian, LAT, ATC  SUBJECTIVE:  Tyler Simon "Matt" is here for Initial Assessment (R shoulder pain)   HPI: His R shoulder symptoms INITIALLY: Began 4-5 months ago w/ no known MOI Described as moderate aching pain that makes it feel "rusty", nonradiating Worsened with R shoulder overhead motion and functional IR/ER Improved with nothing noted Additional associated symptoms include: + mechanical symptoms; no N/T noted in R UE    At this time symptoms show no change compared to onset  He has been not doing anything in particular to treat his R shoulder.  His R elbow symptoms INITIALLY: Began 4-5 months ago w/ no known MOI. Pt really only has pain when he leans on his R elbow. Described as moderate-severe stabbing pain that is very short in duration, nonradiating Worsened with pressure to the elbow I.e. Leaning on his elbow Improved with no pressure to the elbow Additional associated symptoms include: no mechanical symptoms and no swelling noted in the R elbow    At this time symptoms show no change compared to onset   REVIEW OF SYSTEMS: Denies night time disturbances. Denies fevers, chills, or night sweats. Denies unexplained weight loss. Denies personal history of cancer. Denies changes in bowel or bladder habits. Denies recent unreported falls. Denies new or worsening dyspnea or wheezing. Denies headaches or dizziness.  Denies numbness, tingling or weakness  In the extremities.  Denies dizziness or presyncopal episodes Denies lower extremity edema    HISTORY:  Prior history reviewed and updated per electronic medical record.  Social History   Occupational History  .  Occupation: Art gallery manager    Comment: [employer]  Tobacco Use  . Smoking status: Former Smoker    Packs/day: 0.50    Years: 15.00    Pack years: 7.50    Types: Pipe, Cigarettes    Last attempt to quit: 11/21/2017    Years since quitting: 0.7  . Smokeless tobacco: Never Used  Substance and Sexual Activity  . Alcohol use: Yes    Alcohol/week: 0.0 standard drinks    Comment: social  . Drug use: No  . Sexual activity: Yes    Partners: Female   Social History   Social History Narrative   Married to patient Production manager at [employer]   From [place], [place]    DATA OBTAINED & REVIEWED:  No results for input(s): HGBA1C, LABURIC, CREATINE in the last 8760 hours. No problems updated. .   OBJECTIVE:  VS:  HT:5\' 9"  (175.3 cm)   WT:161 lb (73 kg)  BMI:23.76    BP:106/68  HR:(!) 58bpm  TEMP: ( )  RESP:97 %   PHYSICAL EXAM: CONSTITUTIONAL: Well-developed, Well-nourished and In no acute distress PSYCHIATRIC: Alert & appropriately interactive. and Not depressed or anxious appearing. RESPIRATORY: No increased work of breathing and Trachea Midline EYES: Pupils are equal., EOM intact without nystagmus. and No scleral icterus.  VASCULAR EXAM: Warm and well perfused NEURO: unremarkable  MSK Exam: Right shoulder  Well aligned, no significant deformity. No overlying skin changes. TTP over Right AC joint   RANGE OF MOTION & STRENGTH  Full overhead range of motion.  Normal internal, external, empty can testing strength and range   SPECIALITY  TESTING:  Focally tender directly over the [facility] joint.  Pain is worse with overhead lifting.  Positive crepitation with axial load and circumduction.     Right elbow  . Well aligned, no significant deformity. . TTP over Posterior aspect of the olecranon directly at the triceps insertion. . No overlying skin changes.   RANGE OF MOTION & STRENGTH  . Normal, non-painful flexion extension, normal supination/pronation   LIGAMENTOUS TESTING   . Ligamentously stable     ASSESSMENT   1. AC joint arthropathy   2. Tendinitis of right triceps     PLAN:  Pertinent additional documentation may be included in corresponding procedure notes, imaging studies, problem based documentation and patient instructions.  Procedures:  . None  Medications:  Meds ordered this encounter  Medications  . Diclofenac Sodium (PENNSAID) 2 % SOLN    Sig: Place 1 application onto the skin 2 (two) times daily for 1 day.    Dispense:  8 g    Refill:  0  . Diclofenac Sodium (PENNSAID) 2 % SOLN    Sig: Place 1 application onto the skin 2 (two) times daily.    Dispense:  112 g    Refill:  2    Home Phone      [phone] Mobile          [phone]    Discussion/Instructions: No problem-specific Assessment & Plan notes found for this encounter.  Marland Kitchen. Ultimately and essentially beneficial to improve with the slight triceps tendinitis and AC joint arthropathy that he has given symptoms are mild.  Avoidance of exacerbating activities including overhead benchpress/military press. . Discussed red flag symptoms that warrant earlier emergent evaluation and patient voices understanding. . Activity modifications and the importance of avoiding exacerbating activities (limiting pain to no more than a 4 / 10 during or following activity) recommended and discussed. . >50% of this 25 minutes minute visit spent in direct patient counseling and/or coordination of care. Discussion was focused on education regarding the in discussing the pathoetiology and anticipated clinical course of the above condition.  Follow-up:  . Return in about 4 weeks (around 09/04/2018) for repeat clinical exam.  . If any lack of improvement: consider further diagnostic evaluation with X-rays and/or MRI of the elbow/shoulder     CMA/ATC served as scribe during this visit. History, Physical, and Plan performed by medical provider. Documentation and orders reviewed and attested to.       Andrena MewsMichael D Syrita Dovel, DO    [facility] Physician

## 2018-08-07 NOTE — Patient Instructions (Addendum)
Instructions for Duexis, Pennsaid and Vimovo:  Your prescription will be filled through a participating [facility] mail order pharmacy.  You will receive a phone call or text from one of the participating pharmacies which can be located in any state in the [place].  You must communicate directly with them to have this medication filled.  When the pharmacy contacts you, they will need your mailing address (for shipment of the medication) andy they will need payment information if you have a copay (typically no more than $10). If you have not heard from them 2-3 days after your appointment with Dr. Berline Choughigby, contact [facility] directly at [phone].   Pennsaid instructions: You have been given a sample/prescription for Pennsaid, a topical medication.     You are to apply this gel to your injured body part twice daily (morning and evening).   A little goes a long way so you can use about a pea-sized amount for each area.   Spread this small amount over the area into a thin film and let it dry.   Be sure that you do not rub the gel into your skin for more than 10 or 15 seconds otherwise it can irritate you skin.    Once you apply the gel, please do not put any other lotion or clothing in contact with that area for 30 minutes to allow the gel to absorb into your skin.   Some people are sensitive to the medication and can develop a sunburn-like rash.  If you have only mild symptoms it is okay to continue to use the medication but if you have any breakdown of your skin you should discontinue its use and please let us know.   If you have been written a prescription for Pennsaid, you will receive a pump bottle of this topical gel through a mail order pharmacy.  The instructions on the bottle will say to apply two pumps twice a day which may be too much gel for your particular area so use the pea-sized amount as your guide.

## 2018-09-04 ENCOUNTER — Encounter: Payer: Self-pay | Admitting: Sports Medicine

## 2018-09-04 ENCOUNTER — Ambulatory Visit: Payer: BLUE CROSS/BLUE SHIELD | Admitting: Sports Medicine

## 2018-09-04 VITALS — BP 110/72 | HR 52 | Ht 69.0 in | Wt 161.6 lb

## 2018-09-04 DIAGNOSIS — M779 Enthesopathy, unspecified: Secondary | ICD-10-CM

## 2018-09-04 DIAGNOSIS — M25512 Pain in left shoulder: Secondary | ICD-10-CM

## 2018-09-04 DIAGNOSIS — M9907 Segmental and somatic dysfunction of upper extremity: Secondary | ICD-10-CM | POA: Diagnosis not present

## 2018-09-04 DIAGNOSIS — M9908 Segmental and somatic dysfunction of rib cage: Secondary | ICD-10-CM

## 2018-09-04 DIAGNOSIS — M9902 Segmental and somatic dysfunction of thoracic region: Secondary | ICD-10-CM | POA: Diagnosis not present

## 2018-09-04 DIAGNOSIS — M778 Other enthesopathies, not elsewhere classified: Secondary | ICD-10-CM

## 2018-09-04 DIAGNOSIS — G8929 Other chronic pain: Secondary | ICD-10-CM

## 2018-09-04 DIAGNOSIS — M19019 Primary osteoarthritis, unspecified shoulder: Secondary | ICD-10-CM

## 2018-09-04 NOTE — Patient Instructions (Signed)

## 2018-09-04 NOTE — Progress Notes (Signed)
PROCEDURE NOTE : OSTEOPATHIC MANIPULATION The decision today to treat with Osteopathic Manipulative Therapy (OMT) was based on physical exam findings. Verbal consent was obtained following a discussion with the patient regarding the of risks, benefits and potential side effects, including an acute pain flare,post manipulation soreness and need for repeat treatments.     Contraindications to OMT: NONE  Manipulation was performed as below: Regions Treated & Osteopathic Exam Findings  T2 FRS right (Flexed, Rotated & Sidebent) T6 -8 Neutral, Rotated LEFT, Sidebent RIGHT Rib 4 Left  Posterior Right pec minor tenderpoint Right supraspinatous tenderpoint Right trapezius tenderpoint   OMT Techniques Used  . HVLA . muscle energy . myofascial release    The patient tolerated the treatment well and reported Improved symptoms following treatment today. Patient was given medications, exercises, stretches and lifestyle modifications per AVS and verbally.

## 2018-09-04 NOTE — Progress Notes (Signed)
Veverly FellsMichael D. Delorise Shinerigby, DO  [place] [place] [phone]  Harvie JuniorMatthieu Swiger - 32 y.o. male MRN [id number]  Date of birth: 10/03/1985  Visit Date:   PCP: Hannah Beatopland, Spencer, MD   Referred by: Hannah Beatopland, Spencer, MD   SUBJECTIVE:  Chief Complaint  Patient presents with  . f/u R AC joint and R triceps    HPI: Patient is here for follow-up of his right shoulder and arm pain.  Reports only mild aching at his last visit has improved but continues have some shoulder popping.  He has pain with overhead range of motion and terminal ER/IR.  He has been using Pennsaid 3 times a week and gets occasional burning and drying of the skin but no other associated skin breakdown.  REVIEW OF SYSTEMS: Negative 12 point review of systems  HISTORY:  Prior history reviewed and updated per electronic medical record.  Social History   Occupational History  . Occupation: Art gallery managerngineer    Comment: [employer]  Tobacco Use  . Smoking status: Current Some Day Smoker    Packs/day: 0.50    Years: 15.00    Pack years: 7.50    Types: Pipe, Cigarettes    Last attempt to quit: 11/21/2017    Years since quitting: 0.7  . Smokeless tobacco: Never Used  Substance and Sexual Activity  . Alcohol use: Yes    Alcohol/week: 0.0 standard drinks    Comment: social  . Drug use: No  . 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  Invalid input(s): MAGNESIUM, CK No problems updated. No specialty comments available.  OBJECTIVE:  VS:  HT:5\' 9"  (175.3 cm)   WT:161 lb 9.6 oz (73.3 kg)  BMI:23.85    BP:110/72  HR:(!) 52bpm  TEMP: ( )  RESP:97 %   PHYSICAL EXAM: Adult male.  No acute distress.  Alert and appropriate.  Bilateral upper  extremities overall well aligned.  He does have an anterior carriage of his head and shoulders.  Full overhead range of motion of the shoulders.  His empty can test is normal although a slightly discoordinated on the right compared to the left.  He has slight scapular dyskinesis but this is minimal.  Mild pain over the [facility] joint.  Mild pain over the long head of the biceps.  Internally rotated glenohumeral joint with pronated wrist and elbow   ASSESSMENT  1. Somatic dysfunction of upper extremity   2. Chronic left shoulder pain   3. Somatic dysfunction of thoracic region   4. Somatic dysfunction of rib cage region   5. AC joint arthropathy   6. Tendinitis of right triceps     PLAN:  Pertinent additional documentation may be included in corresponding procedure notes, imaging studies, problem based documentation and patient instructions.  Procedures:  Osteopathic manipulation was performed today based on physical exam findings.  Please see procedure note for further information including Osteopathic Exam findings  Medications:  No orders of the defined types were placed in this encounter.   Discussion/Instructions: No problem-specific Assessment & Plan notes found for this encounter.  THERAPEUTIC EXERCISE: Discussed the foundation of treatment for this condition is physical therapy and/or daily (5-6 days/week) therapeutic  exercises, focusing on core strengthening, coordination, neuromuscular control/reeducation.  Continue previously prescribed home exercise program.  Links to [employer] provided today per Patient Instructions.  These exercises were developed by Myles Lipps, DC with a strong emphasis on core neuromuscular reducation and postural realignment through body-weight exercises. RICE (Rest, ICE, Compression, Elevation) principles reviewed with the patient. Discussed appropriate use of both heat and ice with the patient today.  Discussed red flag symptoms that  warrant earlier emergent evaluation and patient voices understanding. Activity modifications and the importance of avoiding exacerbating activities (limiting pain to no more than a 4 / 10 during or following activity) recommended and discussed. Overall she do well with osteopathic manipulation and home therapeutic exercises.  If any persistent ongoing symptoms could consider further diagnostic evaluation with MR arthrogram of the shoulder to evaluate for potential labral tear but this seems less likely   At follow up will plan: to consider repeat osteopathic manipulation Return in about 6 weeks (around 10/16/2018) for consideration of repeat Osteopathic Manipulation, repeat clinical exam.          Andrena Mews, DO    [facility] Physician

## 2018-10-16 ENCOUNTER — Ambulatory Visit: Payer: BLUE CROSS/BLUE SHIELD | Admitting: Sports Medicine

## 2018-10-30 ENCOUNTER — Ambulatory Visit: Payer: BLUE CROSS/BLUE SHIELD | Admitting: Sports Medicine

## 2018-10-30 ENCOUNTER — Encounter: Payer: Self-pay | Admitting: Sports Medicine

## 2018-10-30 VITALS — BP 110/70 | HR 56 | Ht 69.0 in | Wt 167.6 lb

## 2018-10-30 DIAGNOSIS — M19019 Primary osteoarthritis, unspecified shoulder: Secondary | ICD-10-CM

## 2018-10-30 DIAGNOSIS — G8929 Other chronic pain: Secondary | ICD-10-CM

## 2018-10-30 DIAGNOSIS — M9907 Segmental and somatic dysfunction of upper extremity: Secondary | ICD-10-CM

## 2018-10-30 DIAGNOSIS — M9902 Segmental and somatic dysfunction of thoracic region: Secondary | ICD-10-CM | POA: Diagnosis not present

## 2018-10-30 DIAGNOSIS — M9901 Segmental and somatic dysfunction of cervical region: Secondary | ICD-10-CM | POA: Diagnosis not present

## 2018-10-30 DIAGNOSIS — M25511 Pain in right shoulder: Secondary | ICD-10-CM

## 2018-10-30 DIAGNOSIS — M9908 Segmental and somatic dysfunction of rib cage: Secondary | ICD-10-CM

## 2018-10-30 NOTE — Progress Notes (Signed)
Tyler Simon. Tyler Simon [facility] at [facility] [phone]  Tyler Simon - 33 y.o. male MRN [id number]  Date of birth: 1986/08/03  Visit Date: October 30, 2018  PCP: Hannah Beat, MD   Referred by: Hannah Beat, MD  SUBJECTIVE:  Chief Complaint  Patient presents with  . Follow-up    R AC joint/shoulder and R biceps.  Using Pennsaid.  [employer].    HPI: Patient is here for follow-up of bilateral, right worse than left shoulder pain.  He continues to have some popping and clicking.  Symptoms are mild at this time but he does report generalized tightness and discomfort especially with strange move such as overhead reaching.  He is concerned about returning to tennis due to serving coming up later in the spring.  Pain does not keep him awake at night.  REVIEW OF SYSTEMS: No significant nighttime awakenings due to this issue. Denies fevers, chills, recent weight gain or weight loss.  No night sweats.  Pt denies any change in bowel or bladder habits, muscle weakness, numbness or falls associated with this pain.  HISTORY:  Prior history reviewed and updated per electronic medical record.  Patient Active Problem List   Diagnosis Date Noted  . AC joint arthropathy 08/07/2018  . Tendinitis of right triceps 08/07/2018  . OSA (obstructive sleep apnea) 11/23/2017    Mild, mostly supine, AHI 6/h   . Recurrent cold sores 04/24/2017  . Metatarsalgia of both feet 02/09/2017  . Vas deferens stricture 10/17/2013  . Allergic rhinitis due to pollen 07/25/2013  . Pipe smoker 07/25/2013   Social History   Occupational History  . Occupation: Art gallery manager    Comment: [employer]  Tobacco Use  . Smoking status: Current Some Day Smoker    Packs/day: 0.50    Years: 15.00    Pack years: 7.50    Types: Pipe, Cigarettes    Last attempt to quit: 11/21/2017    Years since quitting: 0.9  . Smokeless tobacco: Never Used  Substance and Sexual Activity    . Alcohol use: Yes    Alcohol/week: 0.0 standard drinks    Comment: social  . Drug use: No  . Sexual activity: Yes    Partners: Female   Social History   Social History Narrative   Married to patient Production manager at [employer]   From [place], [place]    OBJECTIVE:  VS:  HT:5\' 9"  (175.3 cm)   WT:167 lb 9.6 oz (76 kg)  BMI:24.74    BP:110/70  HR:(!) 56bpm  TEMP: ( )  RESP:98 %   PHYSICAL EXAM: Adult male. No acute distress.  Alert and appropriate. His right shoulder is overall well aligned.  Small amount of bony prominence over the [facility] joint.  He has some crepitation with axial load and circumduction localizing to deep within the shoulder and over the [facility] joint.  He has good rotator cuff strength with internal rotation, external rotation empty can testing, speeds testing O'Brien's testing.  Minimal pain with all of the above.  He has scapular dyskinesis on the right greater than left with a anterior chain dominance.  Slight limitations in his cervical range of motion including focal muscle spasms especially over the strap muscles and sternohyoid muscles.   ASSESSMENT:   1. Somatic dysfunction of cervical region   2. Somatic dysfunction of thoracic region   3. Somatic dysfunction of rib cage region   4. Somatic dysfunction of upper extremity  5. AC joint arthropathy   6. Chronic right shoulder pain     PROCEDURES:  PROCEDURE NOTE: OSTEOPATHIC MANIPULATION   The decision today to treat with Osteopathic Manipulative Therapy (OMT) was based on physical exam findings. Verbal consent was obtained following a discussion with the patient regarding the of risks, benefits and potential side effects, including an acute pain flare,post manipulation soreness and need for repeat treatments.  NONE  Manipulation was performed as below:  Regions Treated & Osteopathic Exam Findings  CERVICAL SPINE: OA - rotated right C4 Extended, rotated RIGHT, sidebent RIGHT THORACIC SPINE:  T2 - 8  Neutral, rotated RIGHT, sidebent LEFT RIBS:  Rib 1 Right  Exhalation dysfunction (elevated/inhaled) UPPER EXTREMITIES: UPPER EXTREM: Right pec minor tenderpoint Right trapezius tenderpoint    OMT Techniques Used:  HVLA muscle energy myofascial release soft tissue    The patient tolerated the treatment well and reported Improved symptoms following treatment today. Patient was given medications, exercises, stretches and lifestyle modifications per AVS and verbally.       PLAN:  Pertinent additional documentation may be included in corresponding procedure notes, imaging studies, problem based documentation and patient instructions.  No problem-specific Assessment & Plan notes found for this encounter.   This seems to be functional in nature more so than truly anatomic although there is a possibility of a underlying labral tear/SLAP lesion.  Given the discrepancies in force coupling and his desire to try to avoid surgery continuing with therapeutic exercises as well as muscle rebalancing with osteopathic manipulation was discussed.  He does understand that if any recurrence of symptoms with overhead activities such as tennis further diagnostic evaluation with MR arthrogram will be recommended and possible surgical intervention will be needed.  Continue previously prescribed home exercise program.   Osteopathic manipulation was performed today based on physical exam findings.  Patient has responded well to osteopathic manipulation previously the prior manipulation did not provide permanent long lasting relief.  The patient does feel as though there was significant benefit to the prior manipulation and they wished for repeat manipulation today.  They understand that home therapeutic exercises are critical part of the healing/treatment process and will continue with self treatment between now and their next visit as outlined.  The patient understands that the frequency of visits is meant to  provide a stimulus to promote the body's own ability to heal and is not meant to be the sole means for improvement in their symptoms.  Activity modifications and the importance of avoiding exacerbating activities (limiting pain to no more than a 4 / 10 during or following activity) recommended and discussed.  Discussed red flag symptoms that warrant earlier emergent evaluation and patient voices understanding.   No orders of the defined types were placed in this encounter.  Lab Orders  No laboratory test(s) ordered today   Imaging Orders  No imaging studies ordered today   Referral Orders  No referral(s) requested today    Return in about 6 weeks (around 12/11/2018) for consideration of repeat Osteopathic Manipulation.          Andrena MewsMichael D Rigby, DO    [facility] Physician

## 2018-12-12 ENCOUNTER — Ambulatory Visit: Payer: BLUE CROSS/BLUE SHIELD | Admitting: Sports Medicine

## 2019-01-29 ENCOUNTER — Ambulatory Visit: Payer: BLUE CROSS/BLUE SHIELD | Admitting: Sports Medicine

## 2019-09-29 ENCOUNTER — Other Ambulatory Visit: Payer: Self-pay

## 2019-09-29 ENCOUNTER — Encounter: Payer: Self-pay | Admitting: Family Medicine

## 2019-09-29 ENCOUNTER — Ambulatory Visit (INDEPENDENT_AMBULATORY_CARE_PROVIDER_SITE_OTHER): Payer: BC Managed Care – PPO | Admitting: Family Medicine

## 2019-09-29 VITALS — BP 110/76 | HR 65 | Temp 97.9°F | Ht 69.0 in | Wt 165.8 lb

## 2019-09-29 DIAGNOSIS — R21 Rash and other nonspecific skin eruption: Secondary | ICD-10-CM | POA: Diagnosis not present

## 2019-09-29 DIAGNOSIS — L820 Inflamed seborrheic keratosis: Secondary | ICD-10-CM | POA: Diagnosis not present

## 2019-09-29 MED ORDER — HYDROCORTISONE 2.5 % EX CREA: TOPICAL_CREAM | Freq: Two times a day (BID) | CUTANEOUS | 3 refills | Status: DC

## 2019-09-29 MED ORDER — HYDROCORTISONE 2.5 % EX CREA: TOPICAL_CREAM | Freq: Two times a day (BID) | CUTANEOUS | 0 refills | Status: DC

## 2019-09-29 NOTE — Progress Notes (Addendum)
[name], MD Primary Care and Sports Medicine [facility] at [facility] [address] [place], [address] Phone: [phone]  FAX: [phone]  Tyler Simon - 34 y.o. male  MRN [id number]  Date of Birth: May 04, 1986  Visit Date: 09/29/2019  PCP: Hannah Beat, MD  Referred by: Hannah Beat, MD  Chief Complaint  Patient presents with  . Rash    around rectum-painful  . Bump on Scalp  . Concern veins in Right Arm Bicep in more blue than left    This visit occurred during the SARS-CoV-2 public health emergency.  Safety protocols were in place, including screening questions prior to the visit, additional usage of staff PPE, and extensive cleaning of exam room while observing appropriate contact time as indicated for disinfecting solutions.   Subjective:   Tyler Simon is a 34 y.o. very pleasant male patient who presents with the following:  Here for ? Regarding medical conditions.   Rash around rectum.  Used some cream from [place] and some witch hazel.  Bump on scalp. Seb K.Rash on the bottom of fr 3 or 4 years.  More present during the summer.  Combo with hemorrhoids.  Prevents from walking sometimes.  Will hurt sometimes in the sshower.  Sensitive to the touuch.  Seats a lot.  Regular cotoon and enginnerr. Walking a lot.  Friction with walking.    Does have some hyperhidrosis.  ? Always sweating.  Not exervising much.    Veins in R bicep. Normal.   Past Medical History, Surgical History, Social History, Family History, Problem List, Medications, and Allergies have been reviewed and updated if relevant.  Patient Active Problem List   Diagnosis Date Noted  . AC joint arthropathy 08/07/2018  . Tendinitis of right triceps 08/07/2018  . OSA (obstructive sleep apnea) 11/23/2017  . Recurrent cold sores 04/24/2017  . Metatarsalgia of both feet 02/09/2017  . Vas deferens stricture 10/17/2013  . Allergic rhinitis due to pollen  07/25/2013  . Pipe smoker 07/25/2013    Past Medical History:  Diagnosis Date  . Allergic rhinitis due to pollen 07/25/2013  . Complication of anesthesia    woke up during surgery with wisdom tooth  . External hemorrhoid   . Internal hemorrhoids   . Pipe smoker 07/25/2013    Past Surgical History:  Procedure Laterality Date  . APPENDECTOMY  1988  . INGUINAL HERNIA REPAIR Right 10/09/2013   Procedure: Right Groin Exploration;  Surgeon: [name]. Corliss Skains, MD;  Location: [facility] OR;  Service: General;  Laterality: Right;  . WISDOM TOOTH EXTRACTION      Social History   Socioeconomic History  . Marital status: Married    Spouse name: Bo Merino  . Number of children: 0  . Years of education: Not on file  . Highest education level: Not on file  Occupational History  . Occupation: Art gallery manager    Comment: [employer]  Tobacco Use  . Smoking status: Current Some Day Smoker    Packs/day: 0.50    Years: 15.00    Pack years: 7.50    Types: Cigarettes    Last attempt to quit: 11/21/2017    Years since quitting: 1.8  . Smokeless tobacco: Never Used  Substance and Sexual Activity  . Alcohol use: Yes    Alcohol/week: 0.0 standard drinks    Comment: social  . Drug use: No  . Sexual activity: Yes    Partners: Female  Other Topics Concern  . Not on file  Social History Narrative   Married to patient Geneticist, molecular at [place], [place]   Social Determinants of Health   Financial Resource Strain:   . Difficulty of Paying Living Expenses: Not on file  Food Insecurity:   . Worried About Charity fundraiser in the Last Year: Not on file  . Ran Out of Food in the Last Year: Not on file  Transportation Needs:   . Lack of Transportation (Medical): Not on file  . Lack of Transportation (Non-Medical): Not on file  Physical Activity:   . Days of Exercise per Week: Not on file  . Minutes of Exercise per Session: Not on file  Stress:   . Feeling of Stress : Not on file  Social  Connections:   . Frequency of Communication with Friends and Family: Not on file  . Frequency of Social Gatherings with Friends and Family: Not on file  . Attends Religious Services: Not on file  . Active Member of Clubs or Organizations: Not on file  . Attends Archivist Meetings: Not on file  . Marital Status: Not on file  Intimate Partner Violence:   . Fear of Current or Ex-Partner: Not on file  . Emotionally Abused: Not on file  . Physically Abused: Not on file  . Sexually Abused: Not on file    Family History  Problem Relation Age of Onset  . Alzheimer's disease Father   . Colon cancer Neg Hx   . Stomach cancer Neg Hx     No Known Allergies  Medication list reviewed and updated in full in [place].   GEN: No acute illnesses, no fevers, chills. GI: No n/v/d, eating normally Pulm: No SOB Interactive and getting along well at home.  Otherwise, ROS is as per the HPI.  Objective:   BP 110/76   Pulse 65   Temp 97.9 F (36.6 C) (Temporal)   Ht 5\' 9"  (1.753 m)   Wt 165 lb 12 oz (75.2 kg)   SpO2 97%   BMI 24.48 kg/m   GEN: WDWN, NAD, Non-toxic, A & O x 3 HEENT: Atraumatic, Normocephalic. Neck supple. No masses, No LAD. Ears and Nose: No external deformity. CV: RRR, No M/G/R. No JVD. No thrill. No extra heart sounds. PULM: CTA B, no wheezes, crackles, rhonchi. No retractions. No resp. distress. No accessory muscle use. EXTR: No c/c/e NEURO Normal gait.  PSYCH: Normally interactive. Conversant. Not depressed or anxious appearing.  Calm demeanor.   The anal area is mildly inflammed.  There is a seb k on the top of his head that is irritated.  Laboratory and Imaging Data:  Assessment and Plan:     ICD-10-CM   1. Rash  R21   2. Seborrheic keratoses, inflamed  L82.0    Rash, occ hydrocortisone.  Dry well.  Try barrier.  Patient Instructions  Dry until bottom is totally dry after a shower.   Try the barrier cream like A and D or desitin -  a little bit when totally dry.  You can also try some baby powder.  When it's bad, I will give you a light steroid cream   Try to eat more fiber.  You can add in more grains, fiber cereal, or a fiber supplement.     Cryotherapy  Verbal consent was obtained from the patient including possible bad outcomes. Reason: irritated and itchy Location: top of head, cryo x 10 seconds for 3 attempts without difficulty.  Liquid nitrogen was applied using the liquid nitrogen gun without difficulty with an otoscope tip for concentration. Tolerated well without complications.   Follow-up: No follow-ups on file.  Meds ordered this encounter  Medications  . DISCONTD: hydrocortisone 2.5 % cream    Sig: Apply topically 2 (two) times daily.    Dispense:  30 g    Refill:  0  . hydrocortisone 2.5 % cream    Sig: Apply topically 2 (two) times daily.    Dispense:  30 g    Refill:  3    Refills on prior script   No orders of the defined types were placed in this encounter.   Signed,  Elpidio Galea. Keng Jewel, MD   Outpatient Encounter Medications as of 09/29/2019  Medication Sig  . Lysine 1000 MG TABS Take 1 tablet by mouth daily.  . hydrocortisone 2.5 % cream Apply topically 2 (two) times daily.  . [DISCONTINUED] Diclofenac Sodium (PENNSAID) 2 % SOLN Place 1 application onto the skin 2 (two) times daily. (Patient not taking: Reported on 10/30/2018)  . [DISCONTINUED] hydrocortisone 2.5 % cream Apply topically 2 (two) times daily.   No facility-administered encounter medications on file as of 09/29/2019.

## 2019-09-29 NOTE — Patient Instructions (Addendum)
Dry until bottom is totally dry after a shower.   Try the barrier cream like A and D or desitin - a little bit when totally dry.  You can also try some baby powder.  When it's bad, I will give you a light steroid cream   Try to eat more fiber.  You can add in more grains, fiber cereal, or a fiber supplement.

## 2019-10-27 ENCOUNTER — Ambulatory Visit: Payer: BC Managed Care – PPO | Admitting: Orthotics

## 2019-10-27 ENCOUNTER — Ambulatory Visit: Payer: BLUE CROSS/BLUE SHIELD | Admitting: Podiatry

## 2019-10-27 ENCOUNTER — Other Ambulatory Visit: Payer: Self-pay

## 2019-10-27 DIAGNOSIS — M7741 Metatarsalgia, right foot: Secondary | ICD-10-CM

## 2019-10-27 DIAGNOSIS — M722 Plantar fascial fibromatosis: Secondary | ICD-10-CM

## 2019-10-27 DIAGNOSIS — M7752 Other enthesopathy of left foot: Secondary | ICD-10-CM | POA: Diagnosis not present

## 2019-10-27 DIAGNOSIS — M7742 Metatarsalgia, left foot: Secondary | ICD-10-CM

## 2019-10-27 DIAGNOSIS — M778 Other enthesopathies, not elsewhere classified: Secondary | ICD-10-CM

## 2019-10-27 NOTE — Progress Notes (Signed)
Scanned patient for new f/o; however, before placing order going to attempt to modify his LEFT f/o to address MTJ cap and 5th met head pain.   Added met pad and cut out 5th met on f/o and added k-wedge.

## 2019-10-29 NOTE — Progress Notes (Signed)
   Subjective: 34 y.o. male presenting today with a chief complaint of worsening left foot pain that has been ongoing for at least the last year. He notes the pain is located dorsally and at the left fifth toe. Standing at work increases the pain. He has been using custom orthotics but states they are old and worn out so they do not provide much relief. Patient is here for further evaluation and treatment.   Past Medical History:  Diagnosis Date  . Allergic rhinitis due to pollen 07/25/2013  . Complication of anesthesia    woke up during surgery with wisdom tooth  . External hemorrhoid   . Internal hemorrhoids   . Pipe smoker 07/25/2013     Objective: Physical Exam General: The patient is alert and oriented x3 in no acute distress.  Dermatology: Skin is warm, dry and supple bilateral lower extremities. Negative for open lesions or macerations bilateral.   Vascular: Dorsalis Pedis and Posterior Tibial pulses palpable bilateral.  Capillary fill time is immediate to all digits.  Neurological: Epicritic and protective threshold intact bilateral.   Musculoskeletal: Tenderness to palpation to the plantar aspect of the bilateral heels along the plantar fascia. Pain with palpation noted to the left midfoot and left 5th MPJ. All other joints range of motion within normal limits bilateral. Strength 5/5 in all groups bilateral.     Assessment: 1. plantar fasciitis bilateral feet 2. 5th MPJ capsulitis left 3. Left midfoot capsulitis   Plan of Care:  1. Patient evaluated.  2. Appointment with Raiford Noble, Pedorthist, for custom molded orthotics.  3. Continue wearing good shoe gear.  4. Return to clinic as needed.   Art gallery manager for Administrator, sports.   Felecia Shelling, DPM [facility]  Dr. Felecia Shelling, DPM    [address] [place], [place] [address]                Office [phone]  Fax [phone]

## 2019-11-28 ENCOUNTER — Encounter: Payer: BC Managed Care – PPO | Admitting: Orthotics

## 2019-11-28 ENCOUNTER — Other Ambulatory Visit: Payer: Self-pay

## 2019-12-11 ENCOUNTER — Ambulatory Visit: Payer: BC Managed Care – PPO | Admitting: Family Medicine

## 2019-12-11 ENCOUNTER — Encounter: Payer: Self-pay | Admitting: Family Medicine

## 2019-12-11 ENCOUNTER — Other Ambulatory Visit: Payer: Self-pay

## 2019-12-11 ENCOUNTER — Ambulatory Visit (INDEPENDENT_AMBULATORY_CARE_PROVIDER_SITE_OTHER)
Admission: RE | Admit: 2019-12-11 | Discharge: 2019-12-11 | Disposition: A | Discharge status: Home / Self Care | Payer: BC Managed Care – PPO | Source: Ambulatory Visit | Attending: Family Medicine | Admitting: Family Medicine

## 2019-12-11 VITALS — BP 100/60 | HR 54 | Temp 98.1°F | Ht 69.0 in | Wt 168.2 lb

## 2019-12-11 DIAGNOSIS — M25531 Pain in right wrist: Secondary | ICD-10-CM | POA: Diagnosis not present

## 2019-12-11 DIAGNOSIS — S63096A Other dislocation of unspecified wrist and hand, initial encounter: Secondary | ICD-10-CM

## 2019-12-11 DIAGNOSIS — S6991XA Unspecified injury of right wrist, hand and finger(s), initial encounter: Secondary | ICD-10-CM | POA: Diagnosis not present

## 2019-12-11 NOTE — Progress Notes (Signed)
Tyler Simon T. Mindie Rawdon, MD Primary Care and [place] at [facility] [place], [address] Phone: [phone]  FAX: [phone]  [name] - 34 y.o. male  MRN [id number]  Date of Birth: 07/24/1986  Visit Date: 12/11/2019  PCP: Owens Loffler, MD  Referred by: Owens Loffler, MD  Chief Complaint  Patient presents with  . Wrist Pain    Right x 3 months/Fell skiing 2 months ago    This visit occurred during the SARS-CoV-2 public health emergency.  Safety protocols were in place, including screening questions prior to the visit, additional usage of staff PPE, and extensive cleaning of exam room while observing appropriate contact time as indicated for disinfecting solutions.   Subjective:   Tyler Simon is a 34 y.o. very pleasant male patient with Body mass index is 24.85 kg/m. who presents with the following:  R wrist pain s/p fall.  Wrist has been hurting for three months.  Unclearhow it started and then fine.  Now hurting him some more.  Then he went skiing.  He slipped and fell on his wrist.  For two days, he3 could not use it well.  If he forgets one way.    While he was skiing he fell on his right wrist, and since then he has been painful much of the time.  He has been wearing a thumb spica splint, this is not really helped much.  He is having difficulty lifting complaint with his daughter.  He is having difficulty with rotation at the wrist and with some ulnar deviation.  Review of Systems is noted in the HPI, as appropriate   Objective:   BP 100/60   Pulse (!) 54   Temp 98.1 F (36.7 C) (Temporal)   Ht 5\' 9"  (1.753 m)   Wt 168 lb 4 oz (76.3 kg)   SpO2 97%   BMI 24.85 kg/m   GEN: No acute distress; alert,appropriate. PULM: Breathing comfortably in no respiratory distress PSYCH: Normally interactive.   The fingers and metacarpals are nontender.  The proximal ulna and radius are nontender.  Nontender  with supination and pronation at the elbow.  Does have some tenderness at the distal ulna, and he has some notable tenderness in the region of the TFCC.  He has some pain with rotational movements with the wrist extended position.  He also has some pain with axial loading.  The scaphoid is nontender.  Finkelstein's is negative.  Radiology: DG Wrist Complete Right  Result Date: 12/11/2019 CLINICAL DATA:  Trauma 2 months ago with persistent wrist pain, initial encounter- EXAM: RIGHT WRIST - COMPLETE 3+ VIEW COMPARISON:  None. FINDINGS: No acute fracture or dislocation is noted. Mild separation of the scapholunate is noted which may represent some ligamentous injury. No other focal abnormality is noted. IMPRESSION: No acute fracture is noted. Mild separation of the scapholunate space likely related to ligamentous injury. Electronically Signed   By: Inez Catalina M.D.   On: 12/11/2019 16:38     Assessment and Plan:     ICD-10-CM   1. Acute pain of right wrist  M25.531 DG Wrist Complete Right    MR WRIST RIGHT W CONTRAST    DG FLUORO GUIDED NEEDLE PLC ASPIRATION/INJECTION LOC  2. Diastasis of scapholunate joint, initial encounter  S63.096A MR WRIST RIGHT W CONTRAST    DG FLUORO GUIDED NEEDLE PLC ASPIRATION/INJECTION LOC   I agree with radiology that the scapholunate lunate appears to be widened.  Clinical concern for scapholunate dissociation.  Clinical concern for a TFCC tear.  Obtain an MR arthrogram of the right wrist to evaluate for such.  He has failed conservative management for 3 months.  This includes bracing as well as multiple anti-inflammatories.  Further plan of care will be entirely dictated by the patient's MR arthrogram  Follow-up: No follow-ups on file.  No orders of the defined types were placed in this encounter.  Medications Discontinued During This Encounter  Medication Reason  . hydrocortisone 2.5 % cream Completed Course   Orders Placed This Encounter  Procedures  .  DG Wrist Complete Right  . MR WRIST RIGHT W CONTRAST  . DG FLUORO GUIDED NEEDLE PLC ASPIRATION/INJECTION LOC    Signed,  Veralyn Lopp T. Cerinity Zynda, MD   Outpatient Encounter Medications as of 12/11/2019  Medication Sig  . Lysine 1000 MG TABS Take 1 tablet by mouth daily.  . [DISCONTINUED] hydrocortisone 2.5 % cream Apply topically 2 (two) times daily.   No facility-administered encounter medications on file as of 12/11/2019.

## 2019-12-17 ENCOUNTER — Ambulatory Visit
Admission: RE | Admit: 2019-12-17 | Discharge: 2019-12-17 | Disposition: A | Discharge status: Home / Self Care | Payer: BC Managed Care – PPO | Source: Ambulatory Visit | Attending: Family Medicine | Admitting: Family Medicine

## 2019-12-17 ENCOUNTER — Other Ambulatory Visit: Payer: Self-pay

## 2019-12-17 DIAGNOSIS — M25531 Pain in right wrist: Secondary | ICD-10-CM

## 2019-12-17 DIAGNOSIS — S63096A Other dislocation of unspecified wrist and hand, initial encounter: Secondary | ICD-10-CM

## 2019-12-17 MED ORDER — IOPAMIDOL (ISOVUE-M 200) INJECTION 41%
2.0000 mL | Freq: Once | INTRAMUSCULAR | Status: AC
  Administered 2019-12-17: 2 mL via INTRA_ARTICULAR

## 2019-12-21 ENCOUNTER — Other Ambulatory Visit: Payer: Self-pay | Admitting: Family Medicine

## 2019-12-21 DIAGNOSIS — M25531 Pain in right wrist: Secondary | ICD-10-CM

## 2019-12-21 DIAGNOSIS — S63591A Other specified sprain of right wrist, initial encounter: Secondary | ICD-10-CM

## 2019-12-21 DIAGNOSIS — S63096A Other dislocation of unspecified wrist and hand, initial encounter: Secondary | ICD-10-CM

## 2019-12-21 DIAGNOSIS — S638X1A Sprain of other part of right wrist and hand, initial encounter: Secondary | ICD-10-CM

## 2019-12-21 NOTE — Progress Notes (Signed)
ICD-10-CM   1. Diastasis of scapholunate joint, initial encounter  S63.096A Ambulatory referral to Hand Surgery  2. Acute pain of right wrist  M25.531 Ambulatory referral to Hand Surgery  3. Lunotriquetral ligament tear, right, initial encounter  S63.591A Ambulatory referral to Hand Surgery  4. Right scapholunate ligament tear, initial encounter  Y61.6O3F Ambulatory referral to Hand Surgery   There are no discontinued medications. Orders Placed This Encounter  Procedures  . Ambulatory referral to Hand Surgery    Signed,  Karleen Hampshire T. Cardale Dorer, MD

## 2019-12-24 DIAGNOSIS — M25531 Pain in right wrist: Secondary | ICD-10-CM | POA: Diagnosis not present

## 2020-01-13 DIAGNOSIS — M25531 Pain in right wrist: Secondary | ICD-10-CM | POA: Diagnosis not present

## 2020-02-12 DIAGNOSIS — M25531 Pain in right wrist: Secondary | ICD-10-CM | POA: Diagnosis not present

## 2020-02-24 DIAGNOSIS — M6289 Other specified disorders of muscle: Secondary | ICD-10-CM | POA: Diagnosis not present

## 2020-02-24 DIAGNOSIS — M25621 Stiffness of right elbow, not elsewhere classified: Secondary | ICD-10-CM | POA: Diagnosis not present

## 2020-02-24 DIAGNOSIS — M62838 Other muscle spasm: Secondary | ICD-10-CM | POA: Diagnosis not present

## 2020-03-08 DIAGNOSIS — M6289 Other specified disorders of muscle: Secondary | ICD-10-CM | POA: Diagnosis not present

## 2020-03-08 DIAGNOSIS — M25621 Stiffness of right elbow, not elsewhere classified: Secondary | ICD-10-CM | POA: Diagnosis not present

## 2020-03-08 DIAGNOSIS — M62838 Other muscle spasm: Secondary | ICD-10-CM | POA: Diagnosis not present

## 2020-03-10 DIAGNOSIS — M25621 Stiffness of right elbow, not elsewhere classified: Secondary | ICD-10-CM | POA: Diagnosis not present

## 2020-03-10 DIAGNOSIS — M6289 Other specified disorders of muscle: Secondary | ICD-10-CM | POA: Diagnosis not present

## 2020-03-10 DIAGNOSIS — M62838 Other muscle spasm: Secondary | ICD-10-CM | POA: Diagnosis not present

## 2020-03-15 DIAGNOSIS — Z20822 Contact with and (suspected) exposure to covid-19: Secondary | ICD-10-CM | POA: Diagnosis not present

## 2020-03-17 DIAGNOSIS — Z20822 Contact with and (suspected) exposure to covid-19: Secondary | ICD-10-CM | POA: Diagnosis not present

## 2020-03-17 DIAGNOSIS — M6289 Other specified disorders of muscle: Secondary | ICD-10-CM | POA: Diagnosis not present

## 2020-03-17 DIAGNOSIS — M25621 Stiffness of right elbow, not elsewhere classified: Secondary | ICD-10-CM | POA: Diagnosis not present

## 2020-03-17 DIAGNOSIS — M62838 Other muscle spasm: Secondary | ICD-10-CM | POA: Diagnosis not present

## 2020-04-12 DIAGNOSIS — M6289 Other specified disorders of muscle: Secondary | ICD-10-CM | POA: Diagnosis not present

## 2020-04-12 DIAGNOSIS — M25621 Stiffness of right elbow, not elsewhere classified: Secondary | ICD-10-CM | POA: Diagnosis not present

## 2020-04-12 DIAGNOSIS — M62838 Other muscle spasm: Secondary | ICD-10-CM | POA: Diagnosis not present

## 2020-04-14 DIAGNOSIS — M25621 Stiffness of right elbow, not elsewhere classified: Secondary | ICD-10-CM | POA: Diagnosis not present

## 2020-04-14 DIAGNOSIS — M6289 Other specified disorders of muscle: Secondary | ICD-10-CM | POA: Diagnosis not present

## 2020-04-14 DIAGNOSIS — M62838 Other muscle spasm: Secondary | ICD-10-CM | POA: Diagnosis not present

## 2020-04-19 DIAGNOSIS — M6289 Other specified disorders of muscle: Secondary | ICD-10-CM | POA: Diagnosis not present

## 2020-04-19 DIAGNOSIS — M25621 Stiffness of right elbow, not elsewhere classified: Secondary | ICD-10-CM | POA: Diagnosis not present

## 2020-04-19 DIAGNOSIS — M62838 Other muscle spasm: Secondary | ICD-10-CM | POA: Diagnosis not present

## 2020-04-21 DIAGNOSIS — M6289 Other specified disorders of muscle: Secondary | ICD-10-CM | POA: Diagnosis not present

## 2020-04-21 DIAGNOSIS — M25621 Stiffness of right elbow, not elsewhere classified: Secondary | ICD-10-CM | POA: Diagnosis not present

## 2020-04-21 DIAGNOSIS — M62838 Other muscle spasm: Secondary | ICD-10-CM | POA: Diagnosis not present

## 2020-04-26 DIAGNOSIS — M25621 Stiffness of right elbow, not elsewhere classified: Secondary | ICD-10-CM | POA: Diagnosis not present

## 2020-04-26 DIAGNOSIS — M62838 Other muscle spasm: Secondary | ICD-10-CM | POA: Diagnosis not present

## 2020-04-26 DIAGNOSIS — M6289 Other specified disorders of muscle: Secondary | ICD-10-CM | POA: Diagnosis not present

## 2020-04-28 DIAGNOSIS — M6289 Other specified disorders of muscle: Secondary | ICD-10-CM | POA: Diagnosis not present

## 2020-04-28 DIAGNOSIS — M25621 Stiffness of right elbow, not elsewhere classified: Secondary | ICD-10-CM | POA: Diagnosis not present

## 2020-04-28 DIAGNOSIS — M62838 Other muscle spasm: Secondary | ICD-10-CM | POA: Diagnosis not present

## 2020-05-03 DIAGNOSIS — M62838 Other muscle spasm: Secondary | ICD-10-CM | POA: Diagnosis not present

## 2020-05-03 DIAGNOSIS — M25621 Stiffness of right elbow, not elsewhere classified: Secondary | ICD-10-CM | POA: Diagnosis not present

## 2020-05-03 DIAGNOSIS — M6289 Other specified disorders of muscle: Secondary | ICD-10-CM | POA: Diagnosis not present

## 2020-05-13 ENCOUNTER — Encounter: Payer: Self-pay | Admitting: Family Medicine

## 2020-05-13 ENCOUNTER — Ambulatory Visit: Payer: BC Managed Care – PPO | Admitting: Family Medicine

## 2020-05-13 ENCOUNTER — Other Ambulatory Visit: Payer: Self-pay

## 2020-05-13 VITALS — BP 126/76 | HR 51 | Temp 97.7°F | Ht 69.0 in | Wt 168.5 lb

## 2020-05-13 DIAGNOSIS — S638X1A Sprain of other part of right wrist and hand, initial encounter: Secondary | ICD-10-CM | POA: Diagnosis not present

## 2020-05-13 DIAGNOSIS — S63591A Other specified sprain of right wrist, initial encounter: Secondary | ICD-10-CM

## 2020-05-13 DIAGNOSIS — M25531 Pain in right wrist: Secondary | ICD-10-CM

## 2020-05-13 NOTE — Progress Notes (Signed)
Tyler Figuereo T. Neeley Sedivy, MD, CAQ Sports Medicine  Primary Care and Sports Medicine [facility] at [facility] [address] [place], [address]  Phone: [phone]   FAX: [phone]  Tyler Simon - 34 y.o. male   MRN [id number]   Date of Birth: Jan 21, 1986  Date: 05/13/2020   PCP: Hannah Beat, MD   Referral: Hannah Beat, MD  Chief Complaint  Patient presents with   Wrist Pain    right wrist   Ankle Pain    left ankle    This visit occurred during the SARS-CoV-2 public health emergency.  Safety protocols were in place, including screening questions prior to the visit, additional usage of staff PPE, and extensive cleaning of exam room while observing appropriate contact time as indicated for disinfecting solutions.   Subjective:   Tyler Simon is a 34 y.o. very pleasant male patient with Body mass index is 24.88 kg/m. who presents with the following:  He presents with some ongoing R wrist pain, and I saw him about 6 months ago.  With a widened scapholunate space, I did an MR arthrogram on the patient. As below and I ultimately sent him to see Dr. Janee Morn.  He saw him twice and had two wrist injections, and he was supposed to follow-up one month after this.  Ultimately, they are thinking about wrist arthroscopy.  Went and do some PT.  PT - did some dry needling. Did not get any better.    He also has some mild ankle pain.  CLINICAL DATA:  Right wrist pain for the past 3 months.  EXAM: MR OF THE RIGHT WRIST WITH CONTRAST (MR ARTHROGRAM)  TECHNIQUE: Multiplanar, multisequence MR imaging of the right wrist was performed following the administration of intra-articular contrast.  COMPARISON:  Right wrist x-rays dated December 11, 2019.  CONTRAST:  See injection documentation.  FINDINGS: Ligaments: Irregularity of the scapholunate ligament dorsal component (series 3, image 16). Thinning of the scapholunate ligament interosseous component  with loss of the normal triangular shape (series 7, image 11). Irregularity of the scapholunate ligament volar component (series 7, image 9). Suspected small full-thickness tear of the dorsal lunotriquetral ligament (series 7, image 12).  Triangular fibrocartilage: The triangular fibrocartilage complex appears intact. The ulnar variance is neutral.  Tendons: The flexor and extensor tendons appear unremarkable.  Carpal tunnel/median nerve: Unremarkable.  Guyon's canal: Unremarkable.  Joint/cartilage: The radiocarpal joint is distended with intra-articular contrast, which extends into the midcarpal joint. No focal chondral defect.  Bones/carpal alignment: Widening of the scapholunate interval. The carpal bone alignment is otherwise normal. No DISI deformity.There are no significant extra-articular osseous findings.  Other: The periarticular soft tissues appear normal. No ganglia are identified.  IMPRESSION: 1. Partial tear of the scapholunate ligament with possible small full-thickness component involving the volar aspect. 2. Suspected small full-thickness tear of the dorsal lunotriquetral ligament.   Electronically Signed   By: Obie Dredge M.D.   On: 12/17/2019 17:15  Review of Systems is noted in the HPI, as appropriate   Objective:   BP 126/76    Pulse (!) 51    Temp 97.7 F (36.5 C) (Temporal)    Ht 5\' 9"  (1.753 m)    Wt 168 lb 8 oz (76.4 kg)    SpO2 98%    BMI 24.88 kg/m    GEN: No acute distress; alert,appropriate. PULM: Breathing comfortably in no respiratory distress PSYCH: Normally interactive.    Right wrist: Nontender along all the phalanges and  metacarpals.  Nontender at the radius and ulna, he does have some tenderness at the TFCC.  He does have pain with axial loading and to a lesser extent ulnar and radial deviation.  He also has some pain with resisted supination.  To a lesser extent pronation -all keeping the hand stationary and  flat.  Radiology: above  Assessment and Plan:     ICD-10-CM   1. Right scapholunate ligament tear, initial encounter  D14.9F0Y Ambulatory referral to Hand Surgery  2. Acute pain of right wrist  M25.531 Ambulatory referral to Hand Surgery  3. Lunotriquetral ligament tear, right, initial encounter  S63.591A Ambulatory referral to Hand Surgery   Internal derangement of the right wrist.  This is been ongoing for any number of months, greater than 6 months.  He has thus far failed conservative measures including 2 corticosteroid injections as well as a trial of physical therapy.  Before he would consider arthroscopic surgery of his wrist he would like to have a second opinion about his wrist.  I think that that is entirely reasonable for anyone, let alone a 34 year old perfectly healthy gentleman before surgery.  I recommended that he see either Dr. Amanda Pea or Dr. Thomas Hoff for their expert opinion.  I very much appreciate their help.  Reassurance about ankle.  Nontender or minimally tender today in the office.  Orders Placed This Encounter  Procedures   Ambulatory referral to Hand Surgery    Signed,  Taiki Buckwalter T. Francy Mcilvaine, MD   Outpatient Encounter Medications as of 05/13/2020  Medication Sig   Lysine 1000 MG TABS Take 1 tablet by mouth daily.   No facility-administered encounter medications on file as of 05/13/2020.

## 2020-05-17 DIAGNOSIS — Z3141 Encounter for fertility testing: Secondary | ICD-10-CM | POA: Diagnosis not present

## 2020-05-27 ENCOUNTER — Telehealth: Payer: Self-pay

## 2020-05-27 NOTE — Telephone Encounter (Signed)
[facility] - Client TELEPHONE ADVICE RECORD AccessNurse Patient Name: Tyler Simon Gender: Male DOB: [birth year] Age: 34 Y 7 M 4 D Return Phone Number: [phone] (Primary), [phone] (Secondary) Address[place] [address] Client [facility] - Client Client Site [facility] [place] - Day Physician Copland, Karleen Hampshire - MD Contact Type Call Who Is Calling Patient / Member / Family / Caregiver Call Type Triage / Clinical Relationship To Patient Self Return Phone Number [phone] (Primary) Chief Complaint Abdominal Pain Reason for Call Symptomatic / Request for Health Information Initial Comment Caller has lower, left abdominal pain under his rib. He may have pulled a muscle. He has an appointment tomorrow. Translation No Nurse Assessment Nurse: Lynwood Dawley, RN, [place] Date/Time (Eastern Time): 05/27/2020 9:09:08 AM Confirm and document reason for call. If symptomatic, describe symptoms. ---caller states having abd pain under rib cage. stabbing pain. caller states it could be a pulled muscle Has the patient had close contact with a person known or suspected to have the novel coronavirus illness OR traveled / lives in area with major community spread (including international travel) in the last 14 days from the onset of symptoms? * If Asymptomatic, screen for exposure and travel within the last 14 days. ---No Does the patient have any new or worsening symptoms? ---Yes Will a triage be completed? ---Yes Related visit to physician within the last 2 weeks? ---No Does the PT have any chronic conditions? (i.e. diabetes, asthma, this includes High risk factors for pregnancy, etc.) ---No Is this a behavioral health or substance abuse call? ---No Guidelines Guideline Title Affirmed Question Affirmed Notes Nurse Date/Time (Eastern Time) Chest Pain [1] Chest pain (or "angina") comes and  goes AND [2] is happening more often (increasing in frequency) or getting worse (increasing in severity) (Exception: chest pains that last only a few seconds) Matherly, RN, [place] 05/27/2020 9:13:28 AM PLEASE NOTE: All timestamps contained within this report are represented as [place] Standard Time. CONFIDENTIALTY NOTICE: This fax transmission is intended only for the addressee. It contains information that is legally privileged, confidential or otherwise protected from use or disclosure. If you are not the intended recipient, you are strictly prohibited from reviewing, disclosing, copying using or disseminating any of this information or taking any action in reliance on or regarding this information. If you have received this fax in error, please notify us immediately by telephone so that we can arrange for its return to [place]. Phone: [phone], Toll-Free: [phone], Fax: [phone] Page: 2 of 2 Call Id: [id number] Disp. Time Lamount Cohen Time) Disposition Final User 05/27/2020 9:18:35 AM Go to ED Now Yes Lynwood Dawley, RN, [place] Disagree/Comply Comply Caller Understands Yes PreDisposition Call Doctor Care Advice Given Per Guideline GO TO ED NOW: * You need to be seen in the Emergency Department. ANOTHER ADULT SHOULD DRIVE: * It is better and safer if another adult drives instead of you. NOTHING BY MOUTH: * Do not eat or drink anything for now. CARE ADVICE given per Chest Pain (Adult) guideline. Referrals GO TO FACILITY UNDECIDED

## 2020-05-27 NOTE — Telephone Encounter (Signed)
I spoke with pt; pt is going to [facility] on [facility] by the afternoon today and pt will cb if needs to cancel appt with Dr Milinda Antis on 05/28/20. FYI to Dr Milinda Antis and Dr Patsy Lager as PCP.

## 2020-05-28 ENCOUNTER — Encounter: Payer: Self-pay | Admitting: Family Medicine

## 2020-05-28 ENCOUNTER — Ambulatory Visit: Payer: BC Managed Care – PPO | Admitting: Internal Medicine

## 2020-05-28 ENCOUNTER — Ambulatory Visit (INDEPENDENT_AMBULATORY_CARE_PROVIDER_SITE_OTHER): Payer: BC Managed Care – PPO | Admitting: Family Medicine

## 2020-05-28 ENCOUNTER — Other Ambulatory Visit: Payer: Self-pay

## 2020-05-28 DIAGNOSIS — R109 Unspecified abdominal pain: Secondary | ICD-10-CM | POA: Diagnosis not present

## 2020-05-28 NOTE — Progress Notes (Signed)
Subjective:    Patient ID: Tyler Simon, male    DOB: Sep 09, 1986, 34 y.o.   MRN: [id number]  This visit occurred during the SARS-CoV-2 public health emergency.  Safety protocols were in place, including screening questions prior to the visit, additional usage of staff PPE, and extensive cleaning of exam room while observing appropriate contact time as indicated for disinfecting solutions.    HPI 34 yo pt of Dr Patsy Lager presents with c/o L sided abd pain   Wt Readings from Last 3 Encounters:  05/28/20 168 lb 1 oz (76.2 kg)  05/13/20 168 lb 8 oz (76.4 kg)  12/11/19 168 lb 4 oz (76.3 kg)   24.82 kg/m    L sided New He played tennis on Monday- served and bent forward on L  The next day it hurt  Got worse through Thursday (could not do a whole lot-could not run to play with his daughter)   Kept him up at night   Stabbing pain -sharp just under ribs on L  Hurts to move and cough and laugh   Today a little better actually and slept some last night  To the touch it is still tender  No bruising or swelling  No rash   Has not used heat or ice (warm shower helped a little)   He took advil for 3 days  No GI symptoms  No stomach upset No diarrhea or nausea Appetite is fine   Patient Active Problem List   Diagnosis Date Noted  . Abdominal wall pain 05/28/2020  . AC joint arthropathy 08/07/2018  . OSA (obstructive sleep apnea) 11/23/2017  . Recurrent cold sores 04/24/2017  . Vas deferens stricture 10/17/2013  . Allergic rhinitis due to pollen 07/25/2013  . Pipe smoker 07/25/2013   Past Medical History:  Diagnosis Date  . Allergic rhinitis due to pollen 07/25/2013  . Complication of anesthesia    woke up during surgery with wisdom tooth  . External hemorrhoid   . Internal hemorrhoids   . Pipe smoker 07/25/2013   Past Surgical History:  Procedure Laterality Date  . APPENDECTOMY  1988  . INGUINAL HERNIA REPAIR Right 10/09/2013   Procedure: Right Groin Exploration;   Surgeon: [name]. Corliss Skains, MD;  Location: [facility] OR;  Service: General;  Laterality: Right;  . WISDOM TOOTH EXTRACTION     Social History   Tobacco Use  . Smoking status: Current Some Day Smoker    Packs/day: 0.50    Years: 15.00    Pack years: 7.50    Types: Cigarettes    Last attempt to quit: 11/21/2017    Years since quitting: 2.5  . Smokeless tobacco: Never Used  Vaping Use  . Vaping Use: Never used  Substance Use Topics  . Alcohol use: Yes    Alcohol/week: 0.0 standard drinks    Comment: social  . Drug use: No   Family History  Problem Relation Age of Onset  . Alzheimer's disease Father   . Colon cancer Neg Hx   . Stomach cancer Neg Hx    No Known Allergies Current Outpatient Medications on File Prior to Visit  Medication Sig Dispense Refill  . Lysine 1000 MG TABS Take 1 tablet by mouth daily.     No current facility-administered medications on file prior to visit.    Review of Systems  Constitutional: Negative for activity change, appetite change, fatigue, fever and unexpected weight change.  HENT: Negative for congestion, rhinorrhea, sore throat and trouble swallowing.   Eyes:  Negative for pain, redness, itching and visual disturbance.  Respiratory: Negative for cough, chest tightness, shortness of breath and wheezing.   Cardiovascular: Negative for chest pain and palpitations.  Gastrointestinal: Negative for abdominal pain, blood in stool, constipation, diarrhea and nausea.       Abd muscle soreness  Endocrine: Negative for cold intolerance, heat intolerance, polydipsia and polyuria.  Genitourinary: Negative for difficulty urinating, dysuria, frequency and urgency.  Musculoskeletal: Negative for arthralgias, joint swelling and myalgias.  Skin: Negative for pallor and rash.  Neurological: Negative for dizziness, tremors, weakness, numbness and headaches.  Hematological: Negative for adenopathy. Does not bruise/bleed easily.  Psychiatric/Behavioral: Negative for  decreased concentration and dysphoric mood. The patient is not nervous/anxious.        Objective:   Physical Exam Constitutional:      General: He is not in acute distress.    Appearance: He is well-developed and normal weight. He is not ill-appearing.  HENT:     Head: Normocephalic and atraumatic.  Eyes:     General: No scleral icterus.    Conjunctiva/sclera: Conjunctivae normal.     Pupils: Pupils are equal, round, and reactive to light.  Cardiovascular:     Rate and Rhythm: Regular rhythm. Bradycardia present.     Heart sounds: Normal heart sounds.  Pulmonary:     Effort: Pulmonary effort is normal. No respiratory distress.     Breath sounds: No wheezing or rales.  Abdominal:     Comments: Tender in muscles of L abdominal wall under ribs Pain occurs with stretching of lateral abd muscles and sitting up /twisting  No redness/rash or bruising   No rib tenderness  No deep abd tenderness  Musculoskeletal:        General: Tenderness present.     Cervical back: Neck supple.     Right lower leg: No edema.     Left lower leg: No edema.     Comments: Tender in L upper abdomen (wall/muscules)   Lymphadenopathy:     Cervical: No cervical adenopathy.  Neurological:     Mental Status: He is alert.     Coordination: Coordination normal.     Deep Tendon Reflexes: Reflexes normal.  Psychiatric:        Mood and Affect: Mood normal.           Assessment & Plan:   Problem List Items Addressed This Visit      Other   Abdominal wall pain    Suspect abdominal strain from tennis Adv ibuprofen/warm compresses  Relative rest and gentle stretching  Update if not starting to improve in a week or if worsening   Also if GI or other symptoms develop

## 2020-05-28 NOTE — Patient Instructions (Signed)
I think you strained a muscle in the abdominal wall  Use intermittent heat and ice (whatever feels better)  Ibuprofen is helpful (take with food as directed)   Warm up slowly  Don't return to strenuous exercise until better   Watch for rash or swelling or bruising   Update if not starting to improve in a week or if worsening

## 2020-05-30 NOTE — Assessment & Plan Note (Signed)
Suspect abdominal strain from tennis Adv ibuprofen/warm compresses  Relative rest and gentle stretching  Update if not starting to improve in a week or if worsening   Also if GI or other symptoms develop

## 2020-06-10 DIAGNOSIS — M25531 Pain in right wrist: Secondary | ICD-10-CM | POA: Diagnosis not present

## 2020-06-15 DIAGNOSIS — Z3189 Encounter for other procreative management: Secondary | ICD-10-CM | POA: Diagnosis not present

## 2020-07-13 DIAGNOSIS — M25531 Pain in right wrist: Secondary | ICD-10-CM | POA: Diagnosis not present

## 2020-08-03 DIAGNOSIS — Z20822 Contact with and (suspected) exposure to covid-19: Secondary | ICD-10-CM | POA: Diagnosis not present

## 2020-08-09 DIAGNOSIS — Z3141 Encounter for fertility testing: Secondary | ICD-10-CM | POA: Diagnosis not present

## 2020-08-20 DIAGNOSIS — M659 Synovitis and tenosynovitis, unspecified: Secondary | ICD-10-CM | POA: Diagnosis not present

## 2020-08-20 DIAGNOSIS — M25531 Pain in right wrist: Secondary | ICD-10-CM | POA: Diagnosis not present

## 2020-08-20 DIAGNOSIS — G8918 Other acute postprocedural pain: Secondary | ICD-10-CM | POA: Diagnosis not present

## 2020-08-20 DIAGNOSIS — M24131 Other articular cartilage disorders, right wrist: Secondary | ICD-10-CM | POA: Diagnosis not present

## 2020-08-20 DIAGNOSIS — S63591A Other specified sprain of right wrist, initial encounter: Secondary | ICD-10-CM | POA: Diagnosis not present

## 2020-09-02 DIAGNOSIS — M24231 Disorder of ligament, right wrist: Secondary | ICD-10-CM | POA: Diagnosis not present

## 2020-09-02 DIAGNOSIS — M659 Synovitis and tenosynovitis, unspecified: Secondary | ICD-10-CM | POA: Diagnosis not present

## 2020-09-07 DIAGNOSIS — M24231 Disorder of ligament, right wrist: Secondary | ICD-10-CM | POA: Diagnosis not present

## 2020-09-07 DIAGNOSIS — M659 Synovitis and tenosynovitis, unspecified: Secondary | ICD-10-CM | POA: Diagnosis not present

## 2020-09-15 DIAGNOSIS — M659 Synovitis and tenosynovitis, unspecified: Secondary | ICD-10-CM | POA: Diagnosis not present

## 2020-09-15 DIAGNOSIS — M24231 Disorder of ligament, right wrist: Secondary | ICD-10-CM | POA: Diagnosis not present

## 2020-09-21 DIAGNOSIS — M24231 Disorder of ligament, right wrist: Secondary | ICD-10-CM | POA: Diagnosis not present

## 2020-09-21 DIAGNOSIS — M659 Synovitis and tenosynovitis, unspecified: Secondary | ICD-10-CM | POA: Diagnosis not present

## 2020-09-23 DIAGNOSIS — M24231 Disorder of ligament, right wrist: Secondary | ICD-10-CM | POA: Diagnosis not present

## 2020-09-23 DIAGNOSIS — M659 Synovitis and tenosynovitis, unspecified: Secondary | ICD-10-CM | POA: Diagnosis not present

## 2020-09-28 DIAGNOSIS — M659 Synovitis and tenosynovitis, unspecified: Secondary | ICD-10-CM | POA: Diagnosis not present

## 2020-09-28 DIAGNOSIS — M24231 Disorder of ligament, right wrist: Secondary | ICD-10-CM | POA: Diagnosis not present

## 2020-10-01 DIAGNOSIS — N469 Male infertility, unspecified: Secondary | ICD-10-CM | POA: Diagnosis not present

## 2020-10-01 DIAGNOSIS — R869 Unspecified abnormal finding in specimens from male genital organs: Secondary | ICD-10-CM | POA: Diagnosis not present

## 2020-10-08 DIAGNOSIS — N469 Male infertility, unspecified: Secondary | ICD-10-CM | POA: Diagnosis not present

## 2020-10-08 DIAGNOSIS — R869 Unspecified abnormal finding in specimens from male genital organs: Secondary | ICD-10-CM | POA: Diagnosis not present

## 2020-10-11 DIAGNOSIS — M659 Synovitis and tenosynovitis, unspecified: Secondary | ICD-10-CM | POA: Diagnosis not present

## 2020-10-11 DIAGNOSIS — M24231 Disorder of ligament, right wrist: Secondary | ICD-10-CM | POA: Diagnosis not present

## 2020-10-13 DIAGNOSIS — M659 Synovitis and tenosynovitis, unspecified: Secondary | ICD-10-CM | POA: Diagnosis not present

## 2020-10-13 DIAGNOSIS — M24231 Disorder of ligament, right wrist: Secondary | ICD-10-CM | POA: Diagnosis not present

## 2020-10-18 DIAGNOSIS — M24231 Disorder of ligament, right wrist: Secondary | ICD-10-CM | POA: Diagnosis not present

## 2020-10-18 DIAGNOSIS — Z3141 Encounter for fertility testing: Secondary | ICD-10-CM | POA: Diagnosis not present

## 2020-10-18 DIAGNOSIS — M659 Synovitis and tenosynovitis, unspecified: Secondary | ICD-10-CM | POA: Diagnosis not present

## 2020-10-20 DIAGNOSIS — M24231 Disorder of ligament, right wrist: Secondary | ICD-10-CM | POA: Diagnosis not present

## 2020-10-20 DIAGNOSIS — M659 Synovitis and tenosynovitis, unspecified: Secondary | ICD-10-CM | POA: Diagnosis not present

## 2020-10-25 DIAGNOSIS — M659 Synovitis and tenosynovitis, unspecified: Secondary | ICD-10-CM | POA: Diagnosis not present

## 2020-10-25 DIAGNOSIS — M24231 Disorder of ligament, right wrist: Secondary | ICD-10-CM | POA: Diagnosis not present

## 2020-10-27 DIAGNOSIS — M24231 Disorder of ligament, right wrist: Secondary | ICD-10-CM | POA: Diagnosis not present

## 2020-10-27 DIAGNOSIS — M659 Synovitis and tenosynovitis, unspecified: Secondary | ICD-10-CM | POA: Diagnosis not present

## 2020-11-01 DIAGNOSIS — M24231 Disorder of ligament, right wrist: Secondary | ICD-10-CM | POA: Diagnosis not present

## 2020-11-01 DIAGNOSIS — M659 Synovitis and tenosynovitis, unspecified: Secondary | ICD-10-CM | POA: Diagnosis not present

## 2020-11-03 DIAGNOSIS — M24231 Disorder of ligament, right wrist: Secondary | ICD-10-CM | POA: Diagnosis not present

## 2020-11-03 DIAGNOSIS — M659 Synovitis and tenosynovitis, unspecified: Secondary | ICD-10-CM | POA: Diagnosis not present

## 2020-11-08 DIAGNOSIS — E291 Testicular hypofunction: Secondary | ICD-10-CM | POA: Diagnosis not present

## 2020-11-08 DIAGNOSIS — M24231 Disorder of ligament, right wrist: Secondary | ICD-10-CM | POA: Diagnosis not present

## 2020-11-08 DIAGNOSIS — M659 Synovitis and tenosynovitis, unspecified: Secondary | ICD-10-CM | POA: Diagnosis not present

## 2020-11-10 DIAGNOSIS — M659 Synovitis and tenosynovitis, unspecified: Secondary | ICD-10-CM | POA: Diagnosis not present

## 2020-11-10 DIAGNOSIS — M24231 Disorder of ligament, right wrist: Secondary | ICD-10-CM | POA: Diagnosis not present

## 2020-11-15 DIAGNOSIS — M659 Synovitis and tenosynovitis, unspecified: Secondary | ICD-10-CM | POA: Diagnosis not present

## 2020-11-15 DIAGNOSIS — M24231 Disorder of ligament, right wrist: Secondary | ICD-10-CM | POA: Diagnosis not present

## 2020-11-18 DIAGNOSIS — M24231 Disorder of ligament, right wrist: Secondary | ICD-10-CM | POA: Diagnosis not present

## 2020-11-18 DIAGNOSIS — M659 Synovitis and tenosynovitis, unspecified: Secondary | ICD-10-CM | POA: Diagnosis not present

## 2020-12-02 DIAGNOSIS — E291 Testicular hypofunction: Secondary | ICD-10-CM | POA: Diagnosis not present

## 2020-12-07 DIAGNOSIS — M25531 Pain in right wrist: Secondary | ICD-10-CM | POA: Diagnosis not present

## 2021-02-10 DIAGNOSIS — M25531 Pain in right wrist: Secondary | ICD-10-CM | POA: Diagnosis not present

## 2021-03-07 DIAGNOSIS — Z3141 Encounter for fertility testing: Secondary | ICD-10-CM | POA: Diagnosis not present

## 2021-03-13 DIAGNOSIS — Z20822 Contact with and (suspected) exposure to covid-19: Secondary | ICD-10-CM | POA: Diagnosis not present

## 2021-03-14 DIAGNOSIS — E291 Testicular hypofunction: Secondary | ICD-10-CM | POA: Diagnosis not present

## 2021-03-14 DIAGNOSIS — Z20822 Contact with and (suspected) exposure to covid-19: Secondary | ICD-10-CM | POA: Diagnosis not present

## 2021-05-12 ENCOUNTER — Ambulatory Visit: Payer: BC Managed Care – PPO | Admitting: Family Medicine

## 2021-05-16 ENCOUNTER — Ambulatory Visit: Payer: BC Managed Care – PPO | Admitting: Family Medicine

## 2021-05-16 ENCOUNTER — Encounter: Payer: Self-pay | Admitting: Family Medicine

## 2021-05-16 ENCOUNTER — Other Ambulatory Visit: Payer: Self-pay

## 2021-05-16 VITALS — BP 100/70 | HR 49 | Temp 98.4°F | Ht 69.0 in | Wt 169.2 lb

## 2021-05-16 DIAGNOSIS — F411 Generalized anxiety disorder: Secondary | ICD-10-CM | POA: Diagnosis not present

## 2021-05-16 DIAGNOSIS — E291 Testicular hypofunction: Secondary | ICD-10-CM

## 2021-05-16 MED ORDER — FLUOXETINE HCL 20 MG PO CAPS: 20.0000 mg | ORAL_CAPSULE | Freq: Every day | ORAL | 3 refills | Status: DC

## 2021-05-16 NOTE — Progress Notes (Signed)
Tyler Hadlock T. Lia Vigilante, MD, CAQ Sports Medicine [facility] at [facility] [address] [place], [address]  Phone: [phone]  FAX: [phone]  Tyler Simon - 35 y.o. male  MRN [id number]  Date of Birth: 03-Apr-1986  Date: 05/16/2021  PCP: Hannah Beat, MD  Referral: Hannah Beat, MD  Chief Complaint  Patient presents with   Neck Pain   Anxiety    This visit occurred during the SARS-CoV-2 public health emergency.  Safety protocols were in place, including screening questions prior to the visit, additional usage of staff PPE, and extensive cleaning of exam room while observing appropriate contact time as indicated for disinfecting solutions.   Subjective:   Tyler Simon is a 35 y.o. very pleasant male patient with Body mass index is 24.99 kg/m. who presents with the following:  Ongoing neck pain and anxiety.  Has been ha ing a lot of anxiety that has been present for about a year, but it has been not unease with some situations.  Cannot pinpoint it to something.  Can be random times such at night.   He recently started Clomid for fertility  Not a particularly fo thought.  Covid has worsened. Daughter maybe brought on some more.  Sometimes will feel ok, but smetimes most of the day.  Can be thirty minutes and sometimes less or more.  Within a real cause.   Each day, rare.  More irritabe.  W[place]fe has noticed it.    More ok at work.  Tries to manage the best he can   Work is going well.  Tyler Simon has noticed and she thinks acting worse to him Daughter is almost three  Sometimes when not having the most of  Not specific social things.   Day to day. No nausea or sweaty palms.   No panic attacks but had onen recently - had a bad one about 10 years ago. Worse with sleeping poorly and alcohol will make worse.   Mom does take some zoloft  Getting worse the last few months.   Panic attack x 1 - had to stop his driving  Review of  Systems is noted in the HPI, as appropriate  Objective:   BP 100/70   Pulse (!) 49   Temp 98.4 F (36.9 C) (Temporal)   Ht 5\' 9"  (1.753 m)   Wt 169 lb 4 oz (76.8 kg)   SpO2 98%   BMI 24.99 kg/m   GEN: No acute distress; alert,appropriate. PULM: Breathing comfortably in no respiratory distress PSYCH: Normally interactive.   Laboratory and Imaging Data:  Assessment and Plan:     ICD-10-CM   1. GAD (generalized anxiety disorder)  F41.1     2. Hypotestosteronemia in male  E29.1      History above.  Decompensated anxiety without depression.  He has had some anxiety over time, but this appears to be of worsening, particular in the last year and escalating.  We reviewed pathophysiology and neurotransmitters, which he understands.  I am going to place him on some Prozac for at least 6 months and then reassess.  Titration might be needed or potentially change.  Good follow-up.  He also is on Clomid, and he does have a testosterone in the 500s region now on this.  He is taking it for fertility.  Clomid can definitely increase anxiety in females.  I am not 100% sure about this and males particular with normal testosterone, and he is going to see what the reproductive  endocrinologist says.  Patient Instructions  Take 1 tablet every other day for 2 weeks, then increase to 1 tablet a day   Meds ordered this encounter  Medications   FLUoxetine (PROZAC) 20 MG capsule    Sig: Take 1 capsule (20 mg total) by mouth daily.    Dispense:  90 capsule    Refill:  3   Medications Discontinued During This Encounter  Medication Reason   Lysine 1000 MG TABS Completed Course   No orders of the defined types were placed in this encounter.   Follow-up: No follow-ups on file.  [employer] One speech-to-text software was used for transcription in this dictation.  Possible transcriptional errors can occur using Animal nutritionist.   Signed,  Elpidio Galea. Leisha Trinkle, MD   Outpatient Encounter  Medications as of 05/16/2021  Medication Sig   clomiPHENE (CLOMID) 50 MG tablet Take 1 tablet by mouth daily.   FLUoxetine (PROZAC) 20 MG capsule Take 1 capsule (20 mg total) by mouth daily.   [DISCONTINUED] Lysine 1000 MG TABS Take 1 tablet by mouth daily.   No facility-administered encounter medications on file as of 05/16/2021.

## 2021-05-16 NOTE — Patient Instructions (Signed)
Take 1 tablet every other day for 2 weeks, then increase to 1 tablet a day

## 2021-06-13 ENCOUNTER — Telehealth: Payer: Self-pay | Admitting: Family Medicine

## 2021-06-13 NOTE — Telephone Encounter (Signed)
Spoke with [employer].  He states he feels the Prozac is working.  This morning he took it on an empty stomach and driving to work, he started having a panic attack.  He started having tingling in his hands and feet.  His legs became heavy and he had nausea/vomiting.  He states the attack lasted for about and hour to a hour and a half. He states it was very intense but he is feeling better now. He just wanted to report what had happened.  I encouraged him to take the medication with food on his stomach in the future and I did let him know it can take a full 6 to 8 weeks for the medication to have full effect.  Patient states understanding.

## 2021-06-13 NOTE — Telephone Encounter (Signed)
Please call,  Really has not been enough time for the Prozac to start working, at least maximally.  It generally takes about 6 to 8 weeks for it to have full effect.  I think that I am seeing him in a few weeks, and we can think about an adjustment on the dose if we need to at that point.

## 2021-06-13 NOTE — Telephone Encounter (Signed)
Pt called he wanted to discuss the FLUoxetine (PROZAC) 20 MG capsule.  He had a panic attack this morning he believes it is from him taking it on an empty stomach. He said that he is much better now that he has ate

## 2021-06-17 ENCOUNTER — Telehealth: Payer: Self-pay | Admitting: Family Medicine

## 2021-06-17 NOTE — Telephone Encounter (Signed)
Pt called in requesting a call back # [phone]  has medication concern

## 2021-06-20 NOTE — Telephone Encounter (Signed)
Can you call  At least for the first couple of months, I would not have any more than 2 drinks in 24 hours.  We can talk more about it next week, too.

## 2021-06-20 NOTE — Telephone Encounter (Signed)
Spoke to pt. He said he had another episode last week with side effects. Said he took the med at [employer] with food. About 3 hours later he had a few glasses of wine. Started feeling very tired and eyes were burning, so he went to sleep. Woke up an hour later and was having sweats, dizzy, lightheaded, and weak. He went back to sleep and felt better the next morning. HE believes alcohol is the issue. Asking how much time should he wait between taking the med and having alcohol. Knows people who are on similar meds and are able to have alcoholic beverages. He says the medication is working and that he feels better on it. Has an appt next Monday for a F/U.

## 2021-06-21 ENCOUNTER — Telehealth: Payer: Self-pay | Admitting: Family Medicine

## 2021-06-21 NOTE — Telephone Encounter (Signed)
The medication he started 6 weeks ago, is making him feel dizzy, and he has improvement on his anixety, and he was in his garden and when he  came in he felt more weak. He feels like he is in a fog, and he feels weak at times. And wanted to know if he needs to change the dosage of the medication.

## 2021-06-21 NOTE — Telephone Encounter (Signed)
Reached out to access nurse.

## 2021-06-21 NOTE — Telephone Encounter (Signed)
PLEASE NOTE: All timestamps contained within this report are represented as [place] Standard Time. CONFIDENTIALTY NOTICE: This fax transmission is intended only for the addressee. It contains information that is legally privileged, confidential or otherwise protected from use or disclosure. If you are not the intended recipient, you are strictly prohibited from reviewing, disclosing, copying using or disseminating any of this information or taking any action in reliance on or regarding this information. If you have received this fax in error, please notify us immediately by telephone so that we can arrange for its return to [place]. Phone: [phone], Toll-Free: [phone], Fax: [phone] Page: 1 of 2 Call Id: [id number] [facility] Day - Client TELEPHONE ADVICE RECORD AccessNurse Patient Name: Tyler Simon Gender: Male DOB: 1986/08/06 Age: 35 Y 7 M 29 D Return Phone Number: [phone] (Primary) Address: City/ State/ Zip: [place] Client [facility] Day - Client Client Site [facility] - Day Physician Copland, Karleen Hampshire - MD Contact Type Call Who Is Calling Patient / Member / Family / Caregiver Call Type Triage / Clinical Relationship To Patient Self Return Phone Number [phone] (Primary) Chief Complaint Weakness, Generalized Reason for Call Symptomatic / Request for Health Information Initial Comment Caller says that he is having a med reaction that he started 6 weeks ago for anxiety, and it is helping, but he is feeling dizzy, weak, and like he is in a fog. His head feels headache like he is going to start a headache. No confusion. He does not know the zip code at work Translation No Nurse Assessment Nurse: Carylon Perches, RN, Hilda Lias Date/Time (Eastern Time): 06/21/2021 1:03:34 PM Confirm and document reason for call. If symptomatic, describe symptoms. ---Caller states he started fluoxetine 20 mg per  day, six weeks ago. It helps with anxiety, but he feels drugged. He has dizziness and diaphoresis and headache. Wonders if the dose is too much. Does the patient have any new or worsening symptoms? ---Yes Will a triage be completed? ---Yes Related visit to physician within the last 2 weeks? ---No Does the PT have any chronic conditions? (i.e. diabetes, asthma, this includes High risk factors for pregnancy, etc.) ---Yes List chronic conditions. ---anxiety Is this a behavioral health or substance abuse call? ---No Guidelines Guideline Title Affirmed Question Affirmed Notes Nurse Date/Time (Eastern Time) Anxiety and Panic Attack [1] Lightheadedness or dizziness AND [2] persists > 10 minutes AND [3] not relieved by reassurance provided by triager Carylon Perches, RN, Marie 06/21/2021 1:08:49 PM PLEASE NOTE: All timestamps contained within this report are represented as [place] Standard Time. CONFIDENTIALTY NOTICE: This fax transmission is intended only for the addressee. It contains information that is legally privileged, confidential or otherwise protected from use or disclosure. If you are not the intended recipient, you are strictly prohibited from reviewing, disclosing, copying using or disseminating any of this information or taking any action in reliance on or regarding this information. If you have received this fax in error, please notify us immediately by telephone so that we can arrange for its return to [place]. Phone: [phone], Toll-Free: [phone], Fax: [phone] Page: 2 of 2 Call Id: [id number] Disp. Time Lamount Cohen Time) Disposition Final User 06/21/2021 1:12:00 PM Go to ED Now Yes Carylon Perches, RN, [place] Grater Disagree/Comply Disagree Caller Understands Yes PreDisposition Did not know what to do Care Advice Given Per Guideline GO TO ED NOW: * You need to be seen in the Emergency Department. * Leave now. Drive carefully. BRING MEDICINES: ANOTHER ADULT SHOULD  DRIVE: * It is  better and safer if another adult drives instead of you. CARE ADVICE given per Anxiety and Panic Attack (Adult) guideline. Referrals GO TO FACILITY UNDECIDED

## 2021-06-21 NOTE — Telephone Encounter (Signed)
See other phone note. Patient was triaged and per notes went to Urgent Care

## 2021-06-21 NOTE — Telephone Encounter (Signed)
Emily front office mgr asked me to speak with pt who was on the phone; pt had already spoken to access nurse and was advised pt needed to go to ED. Pt does not want to go to ED. Pt said the first 4 wks on prozac pt did not have any issues. Pt said he does not want to stop the prozac completely because pt thinks prozac is helping his anxiety overall. Pt is more anxious today due to the symptoms pt has been having and pt is not sure what to do. Pt has been taking the prozac 20 mg cap once daily around 9 AM. Pt was notified of Dr Cyndie Chime instructions from 06/20/21 and pt voiced understanding. Pt said on 06/20/21 pt had not had a drink in a few days and pt experienced dizziness, slight H/A, heaviness in head, and felt weak. When pt got home from work pt picked up some limbs out of front yard; pt was only picking up limbs for few mins but pt said after picking up sticks he felt SOB and when he went inside and looked in mirror he was very pale. The SOB last about 20 mins and then went away. Pt slept well last night; this morning was OK when woke up and when pt was getting into shower pt said it was hard to keep himself standing; pt said he was focused on the emptiness of floor in the shower. Pt was not sure why he felt that way but pt said he did not feel good at that time and he also felt, foggy, difficult to focus and slow to think. Pt is also more tired than usual. Pt does not know what BP is but will ask office nurse to ck BP before leaving work today.Pt is presently 10' from [facility] and pt does not want to go to ED or UC but would like to be seen today to make sure pt is OK.  No available appts at [facility] or [facility]. I spoke with Dr Ermalene Searing and reviewed above info. Dr Ermalene Searing said sounds like could be side effects from medicine. The prozac 20 mg is a capsule so Dr Ermalene Searing said to hold the Prozac(pt has not taken Prozac today), drink plenty of water, do not drink alcohol today and have office nurse  ck BP; if BP low or if symptoms reoccur to go to UC for eval. Pt voiced understanding. Pt said BP usually is around 100/60. I stayed on phone while pt had office nurse take BP. Nurse was on pts speaker phone while taking pts BP. Now BP 131/79 P 53. Nurse did say pts chest was extremely flushed; not a rash but extremely red.pts face was flushed as well but not as red as pts chest. Pt said he has not been working out or rushing around. Pt is not having CP but pt said he is feeling SOB and dizzy. Pt will have someone take him to UC to be evaluated. Pt will call 06/22/21 with update on how he is feeling. Sending note to Dr Patsy Lager and Dr Ermalene Searing.

## 2021-06-22 NOTE — Telephone Encounter (Signed)
Can you help call  Have him stop the Prozac for one week, then start it every other day.  No alcohol.  This sounds like probably from medication, and his innate anxiety could be playing a role, too.  Can you help schedule a follow-up appointment in 2 weeks.  Feel free to forward to the appropriate person in our office

## 2021-06-22 NOTE — Telephone Encounter (Signed)
Patient notified as instructed by telephone and verbalized understanding. Patient stated that he already has an upcoming appointment scheduled with Dr. Patsy Lager 07/06/21. Patient was offered for appointment to moved up to 07/04/21 which he declined. Patient stated that 07/04/21 would not work for him because he has other things to do.

## 2021-06-27 ENCOUNTER — Ambulatory Visit: Payer: BC Managed Care – PPO | Admitting: Family Medicine

## 2021-06-27 DIAGNOSIS — Z3189 Encounter for other procreative management: Secondary | ICD-10-CM | POA: Diagnosis not present

## 2021-06-30 NOTE — Telephone Encounter (Signed)
Open in error

## 2021-07-06 ENCOUNTER — Encounter: Payer: Self-pay | Admitting: Family Medicine

## 2021-07-06 ENCOUNTER — Other Ambulatory Visit: Payer: Self-pay

## 2021-07-06 ENCOUNTER — Ambulatory Visit: Payer: BC Managed Care – PPO | Admitting: Family Medicine

## 2021-07-06 VITALS — BP 112/70 | HR 63 | Temp 97.0°F | Ht 69.0 in | Wt 165.4 lb

## 2021-07-06 DIAGNOSIS — F411 Generalized anxiety disorder: Secondary | ICD-10-CM

## 2021-07-06 MED ORDER — FLUOXETINE HCL 10 MG PO CAPS: 10.0000 mg | ORAL_CAPSULE | Freq: Every day | ORAL | 3 refills | Status: DC

## 2021-07-06 NOTE — Progress Notes (Signed)
Tyler Recore T. Valrie Jia, MD, CAQ Sports Medicine [facility] [address] [place], [address]  Phone: [phone]  FAX: [phone]  Tyler Simon - 35 y.o. male  MRN [id number]  Date of Birth: December 16, 1985  Date: 07/06/2021  PCP: Tyler Beat, MD  Referral: Tyler Beat, MD  Chief Complaint  Patient presents with   Follow-up    GAD    This visit occurred during the SARS-CoV-2 public health emergency.  Safety protocols were in place, including screening questions prior to the visit, additional usage of staff PPE, and extensive cleaning of exam room while observing appropriate contact time as indicated for disinfecting solutions.   Subjective:   Tyler Simon is a 35 y.o. very pleasant male patient with Body mass index is 24.42 kg/m. who presents with the following:  Details are below.  F/u GAD with some panic attacks.  Currently on Prozac 20 mg  A couple of times felt a little bit.  First time, took Prozac and then felt a little bit dizzy and had some sweaty hands.  Hd been on the full dose and lasted for about ninety minutes.  Felt like might need to pass out.   Thought really bad.  Ended up vomitting.  Was trying to hide it and was walking around the building.   - Had been drinking some alcohol.  Took Prozac, drank a couple of drinks, and then he felt a little bit tired. Woke up and felt like he was burning and dizzy.  Also felt weak and then felt somewhat weird.    Having some dribbling  Review of Systems is noted in the HPI, as appropriate  Objective:   BP 112/70   Pulse 63   Temp (!) 97 F (36.1 C) (Temporal)   Ht 5\' 9"  (1.753 m)   Wt 165 lb 6 oz (75 kg)   SpO2 99%   BMI 24.42 kg/m   GEN: No acute distress; alert,appropriate. PULM: Breathing comfortably in no respiratory distress PSYCH: Normally interactive.   Laboratory and Imaging Data:  Assessment and Plan:     ICD-10-CM   1. GAD (generalized  anxiety disorder)  F41.1      After discussion today, the patient did stop his Prozac for 7 days, and then when he resumed taking 20 mg every other day.  At this dosage, his anxiety is improved, but he does not have any at the additional side effects as below.  He has tried drinking some wine with this in the evening about 1 drink, and this is felt okay as well.  With the sensations described below, think this probably did go along with his higher dose of Prozac as it was achieving good steady state.  We reviewed the pharmacokinetics, and I think an effective 10 mg dose would be likely all he needs.  If his symptoms are not fully stable at 10, I think that we can do an alternate dosage of 10 followed by 20, giving an effective dose of 15 mg daily.  Meds ordered this encounter  Medications   FLUoxetine (PROZAC) 10 MG capsule    Sig: Take 1 capsule (10 mg total) by mouth daily.    Dispense:  90 capsule    Refill:  3   Medications Discontinued During This Encounter  Medication Reason   FLUoxetine (PROZAC) 20 MG capsule    No orders of the defined types were placed in this encounter.   Follow-up: No follow-ups on file.  Dragon Medical One speech-to-text software was used for transcription in this dictation.  Possible transcriptional errors can occur using Animal nutritionist.   Signed,  Tyler Simon. Khamiyah Grefe, MD   Outpatient Encounter Medications as of 07/06/2021  Medication Sig   clomiPHENE (CLOMID) 50 MG tablet Take 1 tablet by mouth daily.   FLUoxetine (PROZAC) 10 MG capsule Take 1 capsule (10 mg total) by mouth daily.   [DISCONTINUED] FLUoxetine (PROZAC) 20 MG capsule Take 1 capsule (20 mg total) by mouth daily.   No facility-administered encounter medications on file as of 07/06/2021.

## 2021-07-23 DIAGNOSIS — Z3189 Encounter for other procreative management: Secondary | ICD-10-CM | POA: Diagnosis not present

## 2021-08-19 DIAGNOSIS — Z3141 Encounter for fertility testing: Secondary | ICD-10-CM | POA: Diagnosis not present

## 2021-09-26 DIAGNOSIS — E291 Testicular hypofunction: Secondary | ICD-10-CM | POA: Diagnosis not present

## 2021-10-04 DIAGNOSIS — Z3141 Encounter for fertility testing: Secondary | ICD-10-CM | POA: Diagnosis not present

## 2021-10-19 DIAGNOSIS — Z3141 Encounter for fertility testing: Secondary | ICD-10-CM | POA: Diagnosis not present

## 2021-11-16 IMAGING — XA DG FLUORO GUIDE NDL PLC/BX
2 series · 2 of 2 positions shown · non-contrast
Comparison: none

CLINICAL DATA: Right wrist pain.

[Series 1: ortho adipose · 1 of 1 slices shown (1 of 2)]
[im 1/1]
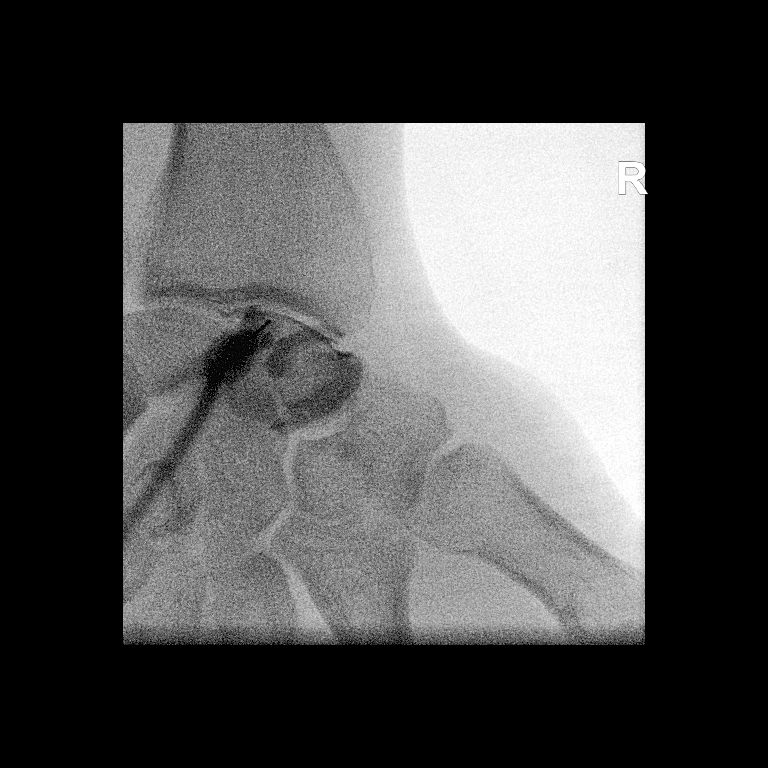

[Series 2: ortho adipose · 1 of 1 slices shown (2 of 2)]
[im 1/1]
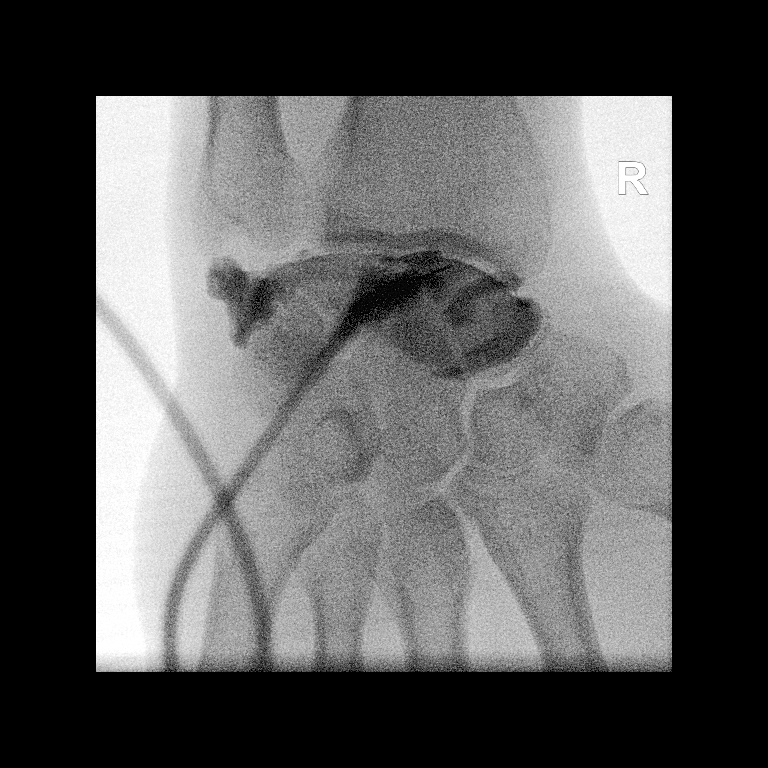

[2 of 2 positions shown; findings below may reference images not displayed]

FLUOROSCOPY TIME:  Radiation Exposure Index (as provided by the
fluoroscopic device): 0.23 Gy*m2

Fluoroscopy Time:  6 seconds

Number of Acquired Images:  0

PROCEDURE:
The risks and benefits of the procedure were discussed with the
patient, and written informed consent was obtained. The patient
stated no history of allergy to contrast media. A formal timeout
procedure was performed with the patient according to departmental
protocol.

The patient was placed supine on the fluoroscopy table and the right
radiocarpal joint was identified under fluoroscopy. The skin
overlying the right radiocarpal joint was subsequently cleaned with
Betadine and a sterile drape was placed over the area of interest.
0.5 ml 1% Lidocaine was used to anesthetize the skin around the
needle insertion site.

A 25 gauge needle was inserted into the right radiocarpal joint
under fluoroscopy.

2 ml of gadolinium mixture (0.1 ml of Multihance mixed with 10 ml of
Isovue-M 200 contrast and 10 ml of sterile saline) were injected
into the right radiocarpal joint.

The needle was removed and hemostasis was achieved. The patient was
subsequently transferred to MRI for imaging.
IMPRESSION: Technically successful right wrist injection for MRI.

## 2022-01-18 ENCOUNTER — Telehealth: Payer: Self-pay | Admitting: Family Medicine

## 2022-01-18 DIAGNOSIS — K649 Unspecified hemorrhoids: Secondary | ICD-10-CM | POA: Diagnosis not present

## 2022-01-18 DIAGNOSIS — K645 Perianal venous thrombosis: Secondary | ICD-10-CM | POA: Diagnosis not present

## 2022-01-18 NOTE — Telephone Encounter (Signed)
Sending note to Dr Patsy Lager; per access note pt is in [place] and will go to ED there. ?

## 2022-01-18 NOTE — Telephone Encounter (Signed)
[facility] Day - Client ?TELEPHONE ADVICE RECORD ?AccessNurse? ?Patient ?Name: ?Tyler Simon ?Gender: Male ?DOB: 04-Oct-1985 ?Age: 36 Y 2 M 28 D ?Return ?Phone ?Number: ?[phone] ?(Primary) ?Address: ?City/ ?State/ ?Zip: [place] ? [address] ?Client [facility] Day - Client ?Client Site [facility] - Day ?Provider Copland, Karleen Hampshire - MD ?Contact Type Call ?Who Is Calling Patient / Member / Family / Caregiver ?Call Type Triage / Clinical ?Relationship To Patient Self ?Return Phone Number [phone] (Primary) ?Chief Complaint Pain - Generalized ?Reason for Call Symptomatic / Request for Health Information ?Initial Comment Caller states he was given a referral to see ?someone for a hemroids but he cannot wait . ?Translation No ?Nurse Assessment ?Nurse: Elesa Hacker RN, Nash Dimmer Date/Time Lamount Cohen Time): 01/18/2022 11:08:27 AM ?Confirm and document reason for call. If ?symptomatic, describe symptoms. ?---Caller states he was given a referral to see someone ?for a hemorrhoids but he cannot wait because of the ?pain. 3 years ago, referral given to see gastro. Caller ?advised that he is having pain. Using OTC treatments, ?no relief. Called gastro, not able to wait to be seen for ?2 weeks. ?Does the patient have any new or worsening ?symptoms? ---Yes ?Will a triage be completed? ---Yes ?Related visit to physician within the last 2 weeks? ---No ?Does the PT have any chronic conditions? (i.e. ?diabetes, asthma, this includes High risk factors for ?pregnancy, etc.) ?---Yes ?List chronic conditions. ---hemorrhoids ?Is this a behavioral health or substance abuse call? ---No ?Guidelines ?Guideline Title Affirmed Question Affirmed Notes Nurse Date/Time (Eastern ?Time) ?Rectal Symptoms Large mass ?protruding out of ?rectum ?Elesa Hacker, RN, Nash Dimmer 01/18/2022 11:11:23 ?AM ?Disp. Time (Eastern ?Time) Disposition Final User ?01/18/2022 11:13:38 AM Go to ED Now Yes Deaton, RN,  Nash Dimmer ?PLEASE NOTE: All timestamps contained within this report are represented as [place] Standard Time. ?CONFIDENTIALTY NOTICE: This fax transmission is intended only for the addressee. It contains information that is legally privileged, confidential or ?otherwise protected from use or disclosure. If you are not the intended recipient, you are strictly prohibited from reviewing, disclosing, copying using ?or disseminating any of this information or taking any action in reliance on or regarding this information. If you have received this fax in error, please ?notify us immediately by telephone so that we can arrange for its return to [place]. Phone: [phone], Toll-Free: [phone], Fax: [phone] ?Page: 2 of 2 ?Call Id: [id number] ?Caller Disagree/Comply Comply ?Caller Understands Yes ?PreDisposition Did not know what to do ?Care Advice Given Per Guideline ?GO TO ED NOW: * You need to be seen in the Emergency Department. * Go to the ED at ___________ Hospital. * Leave now. ?Drive carefully. CARE ADVICE given per Rectal Symptoms (Adult) guideline. ?Comments ?User: Wandra Scot, RN Date/Time Lamount Cohen Time): 01/18/2022 11:15:24 AM ?Caller advised that he is currently in [place] working and that he will go to be seen where he is located. [employer] made ?aware of the [facility] / current location. ?Referrals ?GO TO FACILITY UNDECIDE ?

## 2022-01-18 NOTE — Telephone Encounter (Signed)
May have thrombosed hemorrhoid, which would need more asap care if out of town. ?

## 2022-01-19 DIAGNOSIS — K645 Perianal venous thrombosis: Secondary | ICD-10-CM | POA: Diagnosis not present

## 2022-02-15 ENCOUNTER — Ambulatory Visit: Payer: BC Managed Care – PPO | Admitting: Family Medicine

## 2022-02-15 ENCOUNTER — Encounter: Payer: Self-pay | Admitting: Family Medicine

## 2022-02-15 VITALS — BP 110/74 | HR 44 | Temp 98.4°F | Ht 69.0 in | Wt 163.5 lb

## 2022-02-15 DIAGNOSIS — M542 Cervicalgia: Secondary | ICD-10-CM

## 2022-02-15 DIAGNOSIS — S46811A Strain of other muscles, fascia and tendons at shoulder and upper arm level, right arm, initial encounter: Secondary | ICD-10-CM | POA: Diagnosis not present

## 2022-02-15 MED ORDER — MELOXICAM 15 MG PO TABS: 15.0000 mg | ORAL_TABLET | Freq: Every day | ORAL | 2 refills | Status: DC

## 2022-02-15 NOTE — Progress Notes (Unsigned)
    [name], MD, CAQ Sports Medicine [facility] at [facility] [address] [place], [address]  Phone: [phone]  FAX: [phone]  Tyler Simon - 36 y.o. male  MRN [id number]  Date of Birth: 1986/05/12  Date: 02/15/2022  PCP: Hannah Beat, MD  Referral: Hannah Beat, MD  Chief Complaint  Patient presents with   Neck Pain    X 2 months    Subjective:   Tyler Simon is a 36 y.o. very pleasant male patient with Body mass index is 24.14 kg/m. who presents with the following:  Neck pain x 2 months.  Posterior R sided neck pain.  Muscle will be sore some.  Now with a shooting pain with motion.  Will bother him at night.   L shoulder blade pain, also.   No radicular pain or change in str.  Took some advil - sometimes.  Time only.  Not working out much.  Before then, was p90x three times a week.      Review of Systems is noted in the HPI, as appropriate  Objective:   BP 110/74   Pulse (!) 44   Temp 98.4 F (36.9 C) (Oral)   Ht 5\' 9"  (1.753 m)   Wt 163 lb 8 oz (74.2 kg)   SpO2 98%   BMI 24.14 kg/m   GEN: No acute distress; alert,appropriate. PULM: Breathing comfortably in no respiratory distress PSYCH: Normally interactive.   Laboratory and Imaging Data:  Assessment and Plan:   ***

## 2022-04-10 ENCOUNTER — Ambulatory Visit: Payer: BC Managed Care – PPO | Admitting: Family Medicine

## 2022-04-10 ENCOUNTER — Encounter: Payer: Self-pay | Admitting: Family Medicine

## 2022-04-10 VITALS — BP 110/80 | HR 51 | Temp 98.9°F | Ht 69.0 in | Wt 160.4 lb

## 2022-04-10 DIAGNOSIS — M542 Cervicalgia: Secondary | ICD-10-CM

## 2022-04-10 NOTE — Progress Notes (Signed)
    Tyler Basford T. Franko Hilliker, MD, CAQ Sports Medicine [facility] [address] [place], [address]  Phone: [phone]  FAX: [phone]  Tyler Simon - 36 y.o. male  MRN [id number]  Date of Birth: August 21, 1986  Date: 04/10/2022  PCP: Hannah Beat, MD  Referral: Hannah Beat, MD  Chief Complaint  Patient presents with   Follow-up    Neck Pain     Hemorrhoids   Subjective:   Tyler Simon is a 36 y.o. very pleasant male patient with Body mass index is 23.68 kg/m. who presents with the following:  Saw him in the end of May and she was having some posterior lateral neck pain on the right side.  At that point I gave him some meloxicam.  He also has some issues with hemorrhoids. Had a hemorrhoid flare again last week.    Last time I saw him, felt like he was primarily having issues at the levator scapula on the right side.  At this point, he is having pain in the similar sensation in location, but also additionally in a contralateral location as well.  He continues to deny numbness, tingling, radicular symptoms, or weakness.  No significant shoulder blade pain.  No significant mid spine pain.  I reviewed with him last time he was here McKenzie protocol, and he did this a few times.  Review of Systems is noted in the HPI, as appropriate  Objective:   BP 110/80   Pulse (!) 51   Temp 98.9 F (37.2 C) (Oral)   Ht 5\' 9"  (1.753 m)   Wt 160 lb 6 oz (72.7 kg)   SpO2 99%   BMI 23.68 kg/m   GEN: No acute distress; alert,appropriate. PULM: Breathing comfortably in no respiratory distress PSYCH: Normally interactive.    CERVICAL SPINE EXAM Range of motion: Flexion, extension, lateral bending, and rotation: Full Pain with terminal motion: Mild Spinous Processes: NT SCM: NT Upper paracervical muscles: Tender in the posterior paracervical musculature as well as in the levator scapula.  Tender near the insertion at the occiput. Upper  traps: NT C5-T1 intact, sensation and motor   Laboratory and Imaging Data:  Assessment and Plan:     ICD-10-CM   1. Cervicalgia  M54.2 Ambulatory referral to Physical Therapy     General musculoskeletal neck pain, concentration at the levator scapulae.  No concern for nerve encroachment.  I think this should best be handled conservatively.  I think you are doing a good McKenzie protocol and rehab would help more than anything.  I am going to send him for formal physical therapy   Orders placed today for conditions managed today: Orders Placed This Encounter  Procedures   Ambulatory referral to Physical Therapy    Follow-up if needed: Return if symptoms worsen or fail to improve.  Dragon Medical One speech-to-text software was used for transcription in this dictation.  Possible transcriptional errors can occur using .   Signed,  Animal nutritionist. Viriginia Amendola, MD   Outpatient Encounter Medications as of 04/10/2022  Medication Sig   FLUoxetine (PROZAC) 20 MG capsule Take 20 mg by mouth daily.   meloxicam (MOBIC) 15 MG tablet Take 1 tablet (15 mg total) by mouth daily. (Patient not taking: Reported on 04/10/2022)   [DISCONTINUED] FLUoxetine (PROZAC) 10 MG capsule Take 1 capsule (10 mg total) by mouth daily.   No facility-administered encounter medications on file as of 04/10/2022.

## 2022-04-28 NOTE — Progress Notes (Deleted)
     04/28/2022 Dartagnan Cantero [id number] 1985-12-27  Referring provider: Hannah Beat, MD Primary GI doctor: {acdocs:27040}  ASSESSMENT AND PLAN:   There are no diagnoses linked to this encounter.   Patient Care Team: Hannah Beat, MD as PCP - General (Family Medicine)  HISTORY OF PRESENT ILLNESS: 36 y.o. male with a past medical history of allergies, OSA and others listed below presents for evaluation of hemorrhoids.    Current Medications:      Current Outpatient Medications (Analgesics):    meloxicam (MOBIC) 15 MG tablet, Take 1 tablet (15 mg total) by mouth daily. (Patient not taking: Reported on 04/10/2022)   Current Outpatient Medications (Other):    FLUoxetine (PROZAC) 20 MG capsule, Take 20 mg by mouth daily.  Medical History:  Past Medical History:  Diagnosis Date   Allergic rhinitis due to pollen 07/25/2013   Complication of anesthesia    woke up during surgery with wisdom tooth   External hemorrhoid    Internal hemorrhoids    Pipe smoker 07/25/2013   Allergies: No Known Allergies   Surgical History:  He  has a past surgical history that includes Appendectomy (1988); Wisdom tooth extraction; and Inguinal hernia repair (Right, 10/09/2013). Family History:  His family history includes Alzheimer's disease in his father. Social History:   reports that he quit smoking about 4 years ago. His smoking use included cigarettes. He has a 7.50 pack-year smoking history. He has never used smokeless tobacco. He reports current alcohol use. He reports that he does not use drugs.  REVIEW OF SYSTEMS  : All other systems reviewed and negative except where noted in the History of Present Illness.   PHYSICAL EXAM: There were no vitals taken for this visit. General:   Pleasant, well developed male in no acute distress Head:   Normocephalic and atraumatic. Eyes:  {sclerae:26738},conjunctive {conjuctiva:26739}  Heart:   {HEART EXAM HEM/ONC:21750} Pulm:  Clear  anteriorly; no wheezing Abdomen:   {BlankSingle:19197::"Distended","Ridged","Soft"}, {BlankSingle:19197::"Flat","Obese","Non-distended"} AB, {BlankSingle:19197::"Absent","Hyperactive, tinkling","Hypoactive","Sluggish","Active"} bowel sounds. {actendernessAB:27319} tenderness {anatomy; site abdomen:5010}. {BlankMultiple:19196::"Without guarding","With guarding","Without rebound","With rebound"}, No organomegaly appreciated. Rectal: {acrectalexam:27461} Extremities:  {With/Without:304960234} edema. Msk: Symmetrical without gross deformities. Peripheral pulses intact.  Neurologic:  Alert and  oriented x4;  No focal deficits.  Skin:   Dry and intact without significant lesions or rashes. Psychiatric:  Cooperative. Normal mood and affect.    Doree Albee, PA-C 8:07 AM

## 2022-05-01 ENCOUNTER — Ambulatory Visit: Payer: Self-pay | Admitting: Physician Assistant

## 2022-05-31 ENCOUNTER — Ambulatory Visit (INDEPENDENT_AMBULATORY_CARE_PROVIDER_SITE_OTHER): Payer: Self-pay | Admitting: Family Medicine

## 2022-05-31 ENCOUNTER — Encounter: Payer: Self-pay | Admitting: Family Medicine

## 2022-05-31 VITALS — BP 100/60 | HR 49 | Temp 98.7°F | Ht 69.0 in | Wt 165.2 lb

## 2022-05-31 DIAGNOSIS — R21 Rash and other nonspecific skin eruption: Secondary | ICD-10-CM

## 2022-05-31 DIAGNOSIS — R1031 Right lower quadrant pain: Secondary | ICD-10-CM

## 2022-05-31 MED ORDER — BETAMETHASONE VALERATE 0.1 % EX OINT: 1.0000 | TOPICAL_OINTMENT | Freq: Two times a day (BID) | CUTANEOUS | 0 refills | Status: DC

## 2022-05-31 NOTE — Telephone Encounter (Signed)
Spoke to patient by telephone and scheduled him for an office visit today 05/31/22 with Dr. Patsy Lager at 12:00 noon.

## 2022-05-31 NOTE — Progress Notes (Signed)
    Brittney Caraway T. Kalel Harty, MD, CAQ Sports Medicine [facility] at [facility] [address] [place], [address]  Phone: [phone]  FAX: [phone]  [name] - 36 y.o. male  MRN [id number]  Date of Birth: 05-Sep-1986  Date: 05/31/2022  PCP: Hannah Beat, MD  Referral: Hannah Beat, MD  Chief Complaint  Patient presents with   Rash    On Buttocks   Adenopathy   Subjective:   Tyler Simon is a 36 y.o. very pleasant male patient with Body mass index is 24.4 kg/m. who presents with the following:  2 only things that are different right now.  Groin pain showed up yesterday.  Going up and down a ladder.  Along the inguinal canal - small lump.   Rash that started on the buttocks.  Some pain and swelling in the groin.    Rash on the gluteal cleft with some mild itching and minimal pain.   Upper gluteal cleft rash, mildly irritated in appearance GU: normal male, along upper inguinal canal there is a small bulge and pain on palpation    ICD-10-CM   1. Rash  R21     2. Groin pain, right  R10.31      Total encounter time: 10 minutes. This includes total time spent on the day of encounter.    Reassured about rash - try Lidex  Groin lump, likely very small hernia.  Reassurance.   Medication Management during today's office visit: Meds ordered this encounter  Medications   betamethasone valerate ointment (VALISONE) 0.1 %    Sig: Apply 1 Application topically 2 (two) times daily.    Dispense:  45 g    Refill:  0   Medications Discontinued During This Encounter  Medication Reason   meloxicam (MOBIC) 15 MG tablet Completed Course    Orders placed today for conditions managed today: No orders of the defined types were placed in this encounter.   Disposition: No follow-ups on file.  Dragon Medical One speech-to-text software was used for transcription in this dictation.  Possible transcriptional errors can occur using Animal nutritionist.    Signed,  Elpidio Galea. Rafay Dahan, MD   Outpatient Encounter Medications as of 05/31/2022  Medication Sig   betamethasone valerate ointment (VALISONE) 0.1 % Apply 1 Application topically 2 (two) times daily.   FLUoxetine (PROZAC) 20 MG capsule Take 20 mg by mouth daily.   [DISCONTINUED] meloxicam (MOBIC) 15 MG tablet Take 1 tablet (15 mg total) by mouth daily. (Patient not taking: Reported on 04/10/2022)   No facility-administered encounter medications on file as of 05/31/2022.

## 2022-06-07 ENCOUNTER — Telehealth: Payer: Self-pay | Admitting: Family Medicine

## 2022-06-07 MED ORDER — CLOTRIMAZOLE-BETAMETHASONE 1-0.05 % EX CREA: 1.0000 | TOPICAL_CREAM | Freq: Every day | CUTANEOUS | 0 refills | Status: DC

## 2022-06-07 NOTE — Addendum Note (Signed)
Addended by: Owens Loffler on: 06/07/2022 09:03 AM   Modules accepted: Orders

## 2022-06-07 NOTE — Telephone Encounter (Signed)
Patient called in stating he was seen last week for a rash. He stated that it isn't getting any better and now has a burning sensation. He wants to speak with either you or Dr. Lorelei Pont to discuss what to do next or if something else needed to be prescribed. Thank you!

## 2022-06-07 NOTE — Telephone Encounter (Signed)
Tyler Simon notified by telephone that Dr. Lorelei Pont has sent him in a Rx for Lotrisone Cream.  While on the phone he also mentioned that he has had loose stool for about 7 to 10 days.  He will start the cream and continue to monitor his symptoms and touch base with [place] early next week if no improvement.  FYI to Dr. Lorelei Pont.

## 2022-06-07 NOTE — Telephone Encounter (Signed)
I sent him in some lotrisone cream - it has steroids and an anti-fungal medication in it.

## 2022-06-22 ENCOUNTER — Other Ambulatory Visit: Payer: Self-pay | Admitting: Family Medicine

## 2022-07-26 ENCOUNTER — Other Ambulatory Visit: Payer: Self-pay | Admitting: Family Medicine

## 2022-07-26 DIAGNOSIS — Z131 Encounter for screening for diabetes mellitus: Secondary | ICD-10-CM

## 2022-07-26 DIAGNOSIS — R5383 Other fatigue: Secondary | ICD-10-CM

## 2022-07-26 DIAGNOSIS — Z13220 Encounter for screening for lipoid disorders: Secondary | ICD-10-CM

## 2022-08-02 ENCOUNTER — Other Ambulatory Visit (INDEPENDENT_AMBULATORY_CARE_PROVIDER_SITE_OTHER): Payer: BC Managed Care – PPO

## 2022-08-02 DIAGNOSIS — Z131 Encounter for screening for diabetes mellitus: Secondary | ICD-10-CM | POA: Diagnosis not present

## 2022-08-02 DIAGNOSIS — Z13220 Encounter for screening for lipoid disorders: Secondary | ICD-10-CM

## 2022-08-02 DIAGNOSIS — R5383 Other fatigue: Secondary | ICD-10-CM | POA: Diagnosis not present

## 2022-08-02 LAB — CBC WITH DIFFERENTIAL/PLATELET
Basophils Absolute: 0 10*3/uL (ref 0.0–0.1)
Basophils Relative: 0.7 % (ref 0.0–3.0)
Eosinophils Absolute: 0.4 10*3/uL (ref 0.0–0.7)
Eosinophils Relative: 6.1 % — ABNORMAL HIGH (ref 0.0–5.0)
HCT: 47.8 % (ref 39.0–52.0)
Hemoglobin: 16.1 g/dL (ref 13.0–17.0)
Lymphocytes Relative: 35.4 % (ref 12.0–46.0)
Lymphs Abs: 2.3 10*3/uL (ref 0.7–4.0)
MCHC: 33.7 g/dL (ref 30.0–36.0)
MCV: 89.6 fl (ref 78.0–100.0)
Monocytes Absolute: 0.6 10*3/uL (ref 0.1–1.0)
Monocytes Relative: 9.7 % (ref 3.0–12.0)
Neutro Abs: 3.1 10*3/uL (ref 1.4–7.7)
Neutrophils Relative %: 48.1 % (ref 43.0–77.0)
Platelets: 245 10*3/uL (ref 150.0–400.0)
RBC: 5.33 Mil/uL (ref 4.22–5.81)
RDW: 13.2 % (ref 11.5–15.5)
WBC: 6.4 10*3/uL (ref 4.0–10.5)

## 2022-08-02 LAB — HEPATIC FUNCTION PANEL
ALT: 30 U/L (ref 0–53)
AST: 25 U/L (ref 0–37)
Albumin: 4.7 g/dL (ref 3.5–5.2)
Alkaline Phosphatase: 75 U/L (ref 39–117)
Bilirubin, Direct: 0.1 mg/dL (ref 0.0–0.3)
Total Bilirubin: 0.4 mg/dL (ref 0.2–1.2)
Total Protein: 6.8 g/dL (ref 6.0–8.3)

## 2022-08-02 LAB — LIPID PANEL
Cholesterol: 225 mg/dL — ABNORMAL HIGH (ref 0–200)
HDL: 44.6 mg/dL (ref >39.00)
NonHDL: 180.43
Total CHOL/HDL Ratio: 5
Triglycerides: 261 mg/dL — ABNORMAL HIGH (ref 0.0–149.0)
VLDL: 52.2 mg/dL — ABNORMAL HIGH (ref 0.0–40.0)

## 2022-08-02 LAB — HEMOGLOBIN A1C: Hgb A1c MFr Bld: 5.6 % (ref 4.6–6.5)

## 2022-08-02 LAB — BASIC METABOLIC PANEL
BUN: 16 mg/dL (ref 6–23)
CO2: 30 mEq/L (ref 19–32)
Calcium: 9.3 mg/dL (ref 8.4–10.5)
Chloride: 105 mEq/L (ref 96–112)
Creatinine, Ser: 0.94 mg/dL (ref 0.40–1.50)
GFR: 103.37 mL/min (ref >60.00)
Glucose, Bld: 94 mg/dL (ref 70–99)
Potassium: 4.5 mEq/L (ref 3.5–5.1)
Sodium: 140 mEq/L (ref 135–145)

## 2022-08-02 LAB — LDL CHOLESTEROL, DIRECT: Direct LDL: 127 mg/dL

## 2022-08-09 ENCOUNTER — Encounter: Payer: Self-pay | Admitting: Family Medicine

## 2022-08-12 NOTE — Progress Notes (Signed)
Tyler Baird T. Malaka Ruffner, MD, [place] at [facility] [place], [address]  Phone: [phone]  FAX: [phone]  Tyler Simon - 36 y.o. male  MRN [id number]  Date of Birth: Jul 30, 1986  Date: 08/14/2022  PCP: Tyler Loffler, MD  Referral: Tyler Loffler, MD  Chief Complaint  Patient presents with   Annual Exam   Patient Care Team: Tyler Loffler, MD as PCP - General (Family Medicine) Subjective:   Tyler Simon is a 36 y.o. pleasant patient who presents with the following:  Preventative Health Maintenance Visit:  Health Maintenance Summary Reviewed and updated, unless pt declines services.  Tobacco History Reviewed.  Smoking about 2 a day.  Alcohol: No concerns, no excessive use - 3 glasses a day Exercise Habits: Some activity, rec at least 30 mins 5 times a week STD concerns: no risk or activity to increase risk Drug Use: None  Tyler Simon is a very nice young gentleman known well over the years.  He presents today for a general healthcare exam.  He does have a mildly elevated lipid panel.  Has some soft stools.   Some loose stools, he also gets some external anal irritation.  He does note that he has been eating a little bit worse with some decreased fiber intake and some increased meat intake over the last year.  She also been moving a little bit less compared to his prior baseline. Right now, he is fairly worried about this.  Lipids:    Component Value Date/Time   CHOL 225 (H) 08/02/2022 0758   TRIG 261.0 (H) 08/02/2022 0758   HDL 44.60 08/02/2022 0758   LDLDIRECT 127.0 08/02/2022 0758   VLDL 52.2 (H) 08/02/2022 0758   CHOLHDL 5 08/02/2022 0758    Health Maintenance  Topic Date Due   COVID-19 Vaccine (1) Never done   HIV Screening  Never done   Hepatitis C Screening  Never done   INFLUENZA VACCINE  12/17/2022 (Originally 04/18/2022)   HPV VACCINES  Aged Out   Immunization History  Administered  Date(s) Administered   Tdap 12/26/2017   Patient Active Problem List   Diagnosis Date Noted   OSA (obstructive sleep apnea) 11/23/2017   Vas deferens stricture 10/17/2013   Allergic rhinitis due to pollen 07/25/2013   Pipe smoker 07/25/2013    Past Medical History:  Diagnosis Date   Allergic rhinitis due to pollen 99991111   Complication of anesthesia    woke up during surgery with wisdom tooth   External hemorrhoid    Internal hemorrhoids    Pipe smoker 07/25/2013    Past Surgical History:  Procedure Laterality Date   APPENDECTOMY  1988   INGUINAL HERNIA REPAIR Right 10/09/2013   Procedure: Right Groin Exploration;  Surgeon: Imogene Burn. Georgette Dover, MD;  Location: [facility] OR;  Service: General;  Laterality: Right;   WISDOM TOOTH EXTRACTION      Family History  Problem Relation Age of Onset   Alzheimer's disease Father    Colon cancer Neg Hx    Stomach cancer Neg Hx     Social History   Social History Narrative   Married to patient Geneticist, molecular at [employer]   From [place], [place]    Past Medical History, Surgical History, Social History, Family History, Problem List, Medications, and Allergies have been reviewed and updated if relevant.  Review of Systems: Pertinent positives are listed above.  Otherwise, a full 14 point review of systems has been done  in full and it is negative except where it is noted positive.  Objective:   BP 110/70   Pulse 63   Temp 98.7 F (37.1 C) (Oral)   Ht 5\' 9"  (1.753 m)   Wt 167 lb (75.8 kg)   SpO2 98%   BMI 24.66 kg/m  Ideal Body Weight: Weight in (lb) to have BMI = 25: 168.9  Ideal Body Weight: Weight in (lb) to have BMI = 25: 168.9 No results found.    08/14/2022    8:43 AM 05/16/2021   12:38 PM 09/29/2019    3:35 PM  Depression screen PHQ 2/9  Decreased Interest 0 1 0  Down, Depressed, Hopeless 0 1 0  PHQ - 2 Score 0 2 0  Altered sleeping  2   Tired, decreased energy  3   Change in appetite  0   Feeling bad or failure  about yourself   1   Trouble concentrating  1   Moving slowly or fidgety/restless  1   Suicidal thoughts  0   PHQ-9 Score  10   Difficult doing work/chores  Somewhat difficult      GEN: well developed, well nourished, no acute distress Eyes: conjunctiva and lids normal, PERRLA, EOMI ENT: TM clear, nares clear, oral exam WNL Neck: supple, no lymphadenopathy, no thyromegaly, no JVD Pulm: clear to auscultation and percussion, respiratory effort normal CV: regular rate and rhythm, S1-S2, no murmur, rub or gallop, no bruits, peripheral pulses normal and symmetric, no cyanosis, clubbing, edema or varicosities GI: soft, non-tender; no hepatosplenomegaly, masses; active bowel sounds all quadrants GU: deferred Lymph: no cervical, axillary or inguinal adenopathy MSK: gait normal, muscle tone and strength WNL, no joint swelling, effusions, discoloration, crepitus  SKIN: clear, good turgor, color WNL, no rashes, lesions, or ulcerations Neuro: normal mental status, normal strength, sensation, and motion Psych: alert; oriented to person, place and time, normally interactive and not anxious or depressed in appearance.  All labs reviewed with patient. Results for orders placed or performed in visit on 08/02/22  Lipid panel  Result Value Ref Range   Cholesterol 225 (H) 0 - 200 mg/dL   Triglycerides 621.3261.0 (H) 0.0 - 149.0 mg/dL   HDL 08.6544.60 >78.46>39.00 mg/dL   VLDL 96.252.2 (H) 0.0 - 95.240.0 mg/dL   Total CHOL/HDL Ratio 5    NonHDL 180.43   Hemoglobin A1c  Result Value Ref Range   Hgb A1c MFr Bld 5.6 4.6 - 6.5 %  Hepatic function panel  Result Value Ref Range   Total Bilirubin 0.4 0.2 - 1.2 mg/dL   Bilirubin, Direct 0.1 0.0 - 0.3 mg/dL   Alkaline Phosphatase 75 39 - 117 U/L   AST 25 0 - 37 U/L   ALT 30 0 - 53 U/L   Total Protein 6.8 6.0 - 8.3 g/dL   Albumin 4.7 3.5 - 5.2 g/dL  Basic metabolic panel  Result Value Ref Range   Sodium 140 135 - 145 mEq/L   Potassium 4.5 3.5 - 5.1 mEq/L   Chloride 105 96  - 112 mEq/L   CO2 30 19 - 32 mEq/L   Glucose, Bld 94 70 - 99 mg/dL   BUN 16 6 - 23 mg/dL   Creatinine, Ser 8.410.94 0.40 - 1.50 mg/dL   GFR 324.40103.37 >10.27>60.00 mL/min   Calcium 9.3 8.4 - 10.5 mg/dL  CBC with Differential/Platelet  Result Value Ref Range   WBC 6.4 4.0 - 10.5 K/uL   RBC 5.33 4.22 - 5.81 Mil/uL  Hemoglobin 16.1 13.0 - 17.0 g/dL   HCT 01.6 01.0 - 93.2 %   MCV 89.6 78.0 - 100.0 fl   MCHC 33.7 30.0 - 36.0 g/dL   RDW 35.5 73.2 - 20.2 %   Platelets 245.0 150.0 - 400.0 K/uL   Neutrophils Relative % 48.1 43.0 - 77.0 %   Lymphocytes Relative 35.4 12.0 - 46.0 %   Monocytes Relative 9.7 3.0 - 12.0 %   Eosinophils Relative 6.1 (H) 0.0 - 5.0 %   Basophils Relative 0.7 0.0 - 3.0 %   Neutro Abs 3.1 1.4 - 7.7 K/uL   Lymphs Abs 2.3 0.7 - 4.0 K/uL   Monocytes Absolute 0.6 0.1 - 1.0 K/uL   Eosinophils Absolute 0.4 0.0 - 0.7 K/uL   Basophils Absolute 0.0 0.0 - 0.1 K/uL  LDL cholesterol, direct  Result Value Ref Range   Direct LDL 127.0 mg/dL    Assessment and Plan:     ICD-10-CM   1. Healthcare maintenance  Z00.00     2. Diarrhea, unspecified type  R19.7 Ambulatory referral to Gastroenterology     Think a lot of his issues right now stem from some lifestyle choices.  I think he is drinking little bit too much wine, he started smoking again, and also been exercising less, and eating worse.  All this is compounded to influence some of his symptoms including his worsened cholesterol, and he also has had some underlying neck pain for the last few months.  Think that he really needs to get in shape, change some of his habits.  He is going to try to do this.  Also worried about his intermittent loose stool and diarrhea, and we talked about adding in a basic fiber supplement such as Metamucil or Citrucel.  Also he is going to work on his diet and decrease his meat intake and increase his fiber intake such as fruits and vegetables.  He would like to talk about this with GI, and I think that is  reasonable.  Health Maintenance Exam: The patient's preventative maintenance and recommended screening tests for an annual wellness exam were reviewed in full today. Brought up to date unless services declined.  Counselled on the importance of diet, exercise, and its role in overall health and mortality. The patient's FH and SH was reviewed, including their home life, tobacco status, and drug and alcohol status.  Follow-up in 1 year for physical exam or additional follow-up below.  Disposition: No follow-ups on file.  No orders of the defined types were placed in this encounter.  Medications Discontinued During This Encounter  Medication Reason   betamethasone valerate ointment (VALISONE) 0.1 % Completed Course   clotrimazole-betamethasone (LOTRISONE) cream Completed Course   Orders Placed This Encounter  Procedures   Ambulatory referral to Gastroenterology    Signed,  Karleen Hampshire T. Nimco Bivens, MD   Allergies as of 08/14/2022   No Known Allergies      Medication List        Accurate as of August 14, 2022  9:04 AM. If you have any questions, ask your nurse or doctor.          STOP taking these medications    betamethasone valerate ointment 0.1 % Commonly known as: VALISONE Stopped by: Hannah Beat, MD   clotrimazole-betamethasone cream Commonly known as: LOTRISONE Stopped by: Hannah Beat, MD       TAKE these medications    FLUoxetine 20 MG capsule Commonly known as: PROZAC TAKE 1 CAPSULE(20 MG) BY MOUTH  DAILY

## 2022-08-14 ENCOUNTER — Ambulatory Visit (INDEPENDENT_AMBULATORY_CARE_PROVIDER_SITE_OTHER): Payer: BC Managed Care – PPO | Admitting: Family Medicine

## 2022-08-14 ENCOUNTER — Encounter: Payer: Self-pay | Admitting: Family Medicine

## 2022-08-14 VITALS — BP 110/70 | HR 63 | Temp 98.7°F | Ht 69.0 in | Wt 167.0 lb

## 2022-08-14 DIAGNOSIS — Z0000 Encounter for general adult medical examination without abnormal findings: Secondary | ICD-10-CM | POA: Diagnosis not present

## 2022-08-14 DIAGNOSIS — R197 Diarrhea, unspecified: Secondary | ICD-10-CM

## 2022-09-30 ENCOUNTER — Other Ambulatory Visit: Payer: Self-pay | Admitting: Family Medicine

## 2022-11-13 ENCOUNTER — Encounter: Payer: Self-pay | Admitting: Physician Assistant

## 2022-12-20 ENCOUNTER — Ambulatory Visit (INDEPENDENT_AMBULATORY_CARE_PROVIDER_SITE_OTHER): Payer: BC Managed Care – PPO | Admitting: Physician Assistant

## 2022-12-20 ENCOUNTER — Encounter: Payer: Self-pay | Admitting: Physician Assistant

## 2022-12-20 VITALS — BP 122/78 | HR 75 | Ht 69.0 in | Wt 174.0 lb

## 2022-12-20 DIAGNOSIS — R194 Change in bowel habit: Secondary | ICD-10-CM | POA: Diagnosis not present

## 2022-12-20 DIAGNOSIS — K649 Unspecified hemorrhoids: Secondary | ICD-10-CM | POA: Diagnosis not present

## 2022-12-20 DIAGNOSIS — R197 Diarrhea, unspecified: Secondary | ICD-10-CM

## 2022-12-20 MED ORDER — NA SULFATE-K SULFATE-MG SULF 17.5-3.13-1.6 GM/177ML PO SOLN: 1.0000 | Freq: Once | ORAL | 0 refills | Status: AC

## 2022-12-20 NOTE — Progress Notes (Signed)
Addendum: Reviewed and agree with assessment and management plan. Maurisa Tesmer M, MD  

## 2022-12-20 NOTE — Patient Instructions (Addendum)
You have been scheduled for a colonoscopy. Please follow written instructions given to you at your visit today.  Please pick up your prep supplies at the pharmacy within the next 1-3 days. If you use inhalers (even only as needed), please bring them with you on the day of your procedure.  _______________________________________________________  If your blood pressure at your visit was 140/90 or greater, please contact your primary care physician to follow up on this.  _______________________________________________________  If you are age 52 or older, your body mass index should be between 23-30. Your Body mass index is 25.7 kg/m. If this is out of the aforementioned range listed, please consider follow up with your Primary Care Provider.  If you are age 27 or younger, your body mass index should be between 19-25. Your Body mass index is 25.7 kg/m. If this is out of the aformentioned range listed, please consider follow up with your Primary Care Provider.   ________________________________________________________  The [facility] providers would like to encourage you to use [facility] to communicate with providers for non-urgent requests or questions.  Due to long hold times on the telephone, sending your provider a message by [facility] may be a faster and more efficient way to get a response.  Please allow 48 business hours for a response.  Please remember that this is for non-urgent requests.  _______________________________________________________

## 2022-12-20 NOTE — Progress Notes (Signed)
Chief Complaint: Diarrhea  HPI:    Tyler Simon is a 37 year old male, known to Dr. Hilarie Fredrickson, with a past medical history as listed below including prior hemorrhoid banding in 2019, who was referred to me by Owens Loffler, MD for a complaint of diarrhea.      08/02/2022 CBC, BMP, hepatic function panel, hemoglobin A1c all normal.    08/14/2022 office visit with PCP and at that time describes some soft stools and some loose stools with external anal irritation, he had been eating a little bit worse with some decreased fiber intake and some increased meat intake over the past year and had been moving a little bit less compared to his baseline.  That time was thought likely his lifestyle choices were affecting a lot of his issues.  They talked about adding in fiber supplement.  He was also been to work on his diet.    Today, patient explains that he has started to exercise 2 to 3 days a week and has increased fiber in his diet.  He does admit that he drinks too much, he is [place] and typically will have a beer when he comes home and then a glass or 2 of wine and then another with dinner.  This is a daily thing for him.  He is trying to decrease this but tells me he goes on work trips for a couple days at a time and will not drink at all and still has trouble with his bowel movements.  Describes that he will eat and about 30 minutes later he usually has a soft or diarrheal stool.  Also has occasional rash down near his rectum.  Tells me he spoke with his mother who described an aunt with "intestinal cancer".  He is worried that something else is going on.  Tells me that all of these problems are not all the time but it is enough to worry him.  He is anxious and tends to be a "hypochondriac".    Moved to [place] for his wife from [place].  He did work in [place] for a short period of time.    Denies fever, chills, weight loss, blood in his stool, nausea, vomiting, abdominal pain or symptoms that awaken him  from sleep.  Past Medical History:  Diagnosis Date   Allergic rhinitis due to pollen 99991111   Complication of anesthesia    woke up during surgery with wisdom tooth   External hemorrhoid    Internal hemorrhoids    Pipe smoker 07/25/2013    Past Surgical History:  Procedure Laterality Date   APPENDECTOMY  1988   INGUINAL HERNIA REPAIR Right 10/09/2013   Procedure: Right Groin Exploration;  Surgeon: Imogene Burn. Tsuei, MD;  Location: [facility];  Service: General;  Laterality: Right;   WISDOM TOOTH EXTRACTION      Current Outpatient Medications  Medication Sig Dispense Refill   FLUoxetine (PROZAC) 20 MG capsule TAKE 1 CAPSULE(20 MG) BY MOUTH DAILY 90 capsule 1   No current facility-administered medications for this visit.    Allergies as of 12/20/2022   (No Known Allergies)    Family History  Problem Relation Age of Onset   Alzheimer's disease Father    Colon cancer Neg Hx    Stomach cancer Neg Hx     Social History   Socioeconomic History   Marital status: Married    Spouse name: Roland Earl   Number of children: 0   Years of education: Not on file  Highest education level: Not on file  Occupational History   Occupation: Engineer    Comment: [employer]  Tobacco Use   Smoking status: Every Day    Packs/day: 0.50    Years: 15.00    Additional pack years: 0.00    Total pack years: 7.50    Types: Cigarettes    Last attempt to quit: 11/21/2017    Years since quitting: 5.0   Smokeless tobacco: Never  Vaping Use   Vaping Use: Never used  Substance and Sexual Activity   Alcohol use: Yes    Alcohol/week: 0.0 standard drinks of alcohol    Comment: social   Drug use: No   Sexual activity: Yes    Partners: Female  Other Topics Concern   Not on file  Social History Narrative   Married to patient Geneticist, molecular at [place], [place]   Social Determinants of Radio broadcast assistant Strain: Not on file  Food Insecurity: Not on file  Transportation  Needs: Not on file  Physical Activity: Not on file  Stress: Not on file  Social Connections: Not on file  Intimate Partner Violence: Not on file    Review of Systems:    Constitutional: No weight loss, fever or chills Skin: No itching Cardiovascular: No chest pain Respiratory: No SOB  Gastrointestinal: See HPI and otherwise negative Genitourinary: No dysuria  Neurological: No headache, dizziness or syncope Musculoskeletal: No new muscle or joint pain Hematologic: No bleeding  Psychiatric: +anxiety   Physical Exam:  Vital signs: BP 122/78   Pulse 75   Ht 5\' 9"  (1.753 m)   Wt 174 lb (78.9 kg)   BMI 25.70 kg/m    Constitutional:   Pleasant Caucasian male appears to be in NAD, Well developed, Well nourished, alert and cooperative Head:  Normocephalic and atraumatic. Eyes:   PEERL, EOMI. No icterus. Conjunctiva pink. Ears:  Normal auditory acuity. Neck:  Supple Throat: Oral cavity and pharynx without inflammation, swelling or lesion.  Respiratory: Respirations even and unlabored. Lungs clear to auscultation bilaterally.   No wheezes, crackles, or rhonchi.  Cardiovascular: Normal S1, S2. No MRG. Regular rate and rhythm. No peripheral edema, cyanosis or pallor.  Gastrointestinal:  Soft, nondistended, nontender. No rebound or guarding. Normal bowel sounds. No appreciable masses or hepatomegaly. Rectal:  Not performed.  Msk:  Symmetrical without gross deformities. Without edema, no deformity or joint abnormality.  Neurologic:  Alert and  oriented x4;  grossly normal neurologically.  Skin:   Dry and intact without significant lesions or rashes. Psychiatric: Demonstrates good judgement and reason without abnormal affect or behaviors.  RELEVANT LABS AND IMAGING: CBC    Component Value Date/Time   WBC 6.4 08/02/2022 0758   RBC 5.33 08/02/2022 0758   HGB 16.1 08/02/2022 0758   HCT 47.8 08/02/2022 0758   PLT 245.0 08/02/2022 0758   MCV 89.6 08/02/2022 0758   MCH 31.2 10/02/2013  0940   MCHC 33.7 08/02/2022 0758   RDW 13.2 08/02/2022 0758   LYMPHSABS 2.3 08/02/2022 0758   MONOABS 0.6 08/02/2022 0758   EOSABS 0.4 08/02/2022 0758   BASOSABS 0.0 08/02/2022 0758    CMP     Component Value Date/Time   NA 140 08/02/2022 0758   K 4.5 08/02/2022 0758   CL 105 08/02/2022 0758   CO2 30 08/02/2022 0758   GLUCOSE 94 08/02/2022 0758   BUN 16 08/02/2022 0758   CREATININE 0.94 08/02/2022 0758   CALCIUM 9.3 08/02/2022  0758   PROT 6.8 08/02/2022 0758   ALBUMIN 4.7 08/02/2022 0758   AST 25 08/02/2022 0758   ALT 30 08/02/2022 0758   ALKPHOS 75 08/02/2022 0758   BILITOT 0.4 08/02/2022 0758   GFRNONAA >90 10/02/2013 0940   GFRAA >90 10/02/2013 0940    Assessment: 1.  Change in bowel habits: Describes that he will have a loose or diarrheal stool about 30 minutes after eating pretty much every day, does have an increased alcohol intake of at least 3-4 alcoholic beverages per night; consider IBS versus diet versus alcohol versus other 2.  Family history of colon cancer: In his aunt  Plan: 1.  Discussed with patient that the alcohol could certainly be contributing to his diarrhea.  He is anxious though and would feel better to have a colonoscopy. 2.  Martin Majestic ahead and scheduled patient for diagnostic colonoscopy in the [place] with Dr. Hilarie Fredrickson.  Did provide the patient a detailed list of risks of procedure and he agrees to proceed. Patient is appropriate for endoscopic procedure(s) in the ambulatory ([place]) setting.  3.  Patient to follow in clinic per recommendations from Dr. Hilarie Fredrickson after time of procedure.  He will likely need follow-up to discuss what is likely IBS.  Ellouise Newer, PA-C [facility] 12/20/2022, 9:33 AM  Cc: Owens Loffler, MD

## 2022-12-22 ENCOUNTER — Ambulatory Visit (AMBULATORY_SURGERY_CENTER): Payer: BC Managed Care – PPO | Admitting: Internal Medicine

## 2022-12-22 ENCOUNTER — Encounter: Payer: Self-pay | Admitting: Internal Medicine

## 2022-12-22 VITALS — BP 116/64 | HR 48 | Temp 96.9°F | Resp 12 | Ht 69.0 in | Wt 174.0 lb

## 2022-12-22 DIAGNOSIS — R194 Change in bowel habit: Secondary | ICD-10-CM | POA: Diagnosis not present

## 2022-12-22 DIAGNOSIS — D123 Benign neoplasm of transverse colon: Secondary | ICD-10-CM | POA: Diagnosis not present

## 2022-12-22 DIAGNOSIS — K635 Polyp of colon: Secondary | ICD-10-CM | POA: Diagnosis not present

## 2022-12-22 MED ORDER — SODIUM CHLORIDE 0.9 % IV SOLN: 500.0000 mL | Freq: Once | INTRAVENOUS | Status: DC

## 2022-12-22 NOTE — Op Note (Signed)
[facility] Patient Name: Tyler Simon Procedure Date: 12/22/2022 3:12 PM MRN: [id number] Endoscopist: Beverley Fiedler , MD, [id number] Age: 37 Referring MD:  Date of Birth: 06-Apr-1986 Gender: Male Account #: [id number] Procedure:                Colonoscopy Indications:              Change in bowel habits Medicines:                Monitored Anesthesia Care Procedure:                Pre-Anesthesia Assessment:                           - Prior to the procedure, a History and Physical                            was performed, and patient medications and                            allergies were reviewed. The patient's tolerance of                            previous anesthesia was also reviewed. The risks                            and benefits of the procedure and the sedation                            options and risks were discussed with the patient.                            All questions were answered, and informed consent                            was obtained. Prior Anticoagulants: The patient has                            taken no anticoagulant or antiplatelet agents. ASA                            Grade Assessment: II - A patient with mild systemic                            disease. After reviewing the risks and benefits,                            the patient was deemed in satisfactory condition to                            undergo the procedure.                           After obtaining informed consent, the colonoscope  was passed under direct vision. Throughout the                            procedure, the patient's blood pressure, pulse, and                            oxygen saturations were monitored continuously. The                            Olympus CF-HQ190L SN [id number] was introduced through                            the anus and advanced to the cecum, identified by                            appendiceal orifice and ileocecal  valve. The                            colonoscopy was performed without difficulty. The                            patient tolerated the procedure well. The quality                            of the bowel preparation was good. The ileocecal                            valve, appendiceal orifice, and rectum were                            photographed. Scope In: 3:20:09 PM Scope Out: 3:34:38 PM Scope Withdrawal Time: 0 hours 12 minutes 12 seconds  Total Procedure Duration: 0 hours 14 minutes 29 seconds  Findings:                 The digital rectal exam was normal.                           Two sessile polyps were found in the transverse                            colon. The polyps were 8 to 10 mm in size. These                            polyps were removed with a cold snare. Resection                            and retrieval were complete.                           A post hemorrhoidal banding scars were found in the                            distal rectum.  The exam was otherwise without abnormality on                            direct and retroflexion views. Complications:            No immediate complications. Estimated Blood Loss:     Estimated blood loss was minimal. Impression:               - Two 8 to 10 mm polyps in the transverse colon,                            removed with a cold snare. Resected and retrieved.                           - Post hemorrhoidal banding scars in the distal                            rectum. Recommendation:           - Patient has a contact number available for                            emergencies. The signs and symptoms of potential                            delayed complications were discussed with the                            patient. Return to normal activities tomorrow.                            Written discharge instructions were provided to the                            patient.                           -  Resume previous diet.                           - Continue present medications.                           - Await pathology results.                           - Repeat colonoscopy is recommended for                            surveillance. The colonoscopy date will be                            determined after pathology results from today's                            exam become available for review. Beverley FiedlerJay M Khloi Rawl, MD 12/22/2022 3:38:28 PM This report has  been signed electronically.

## 2022-12-22 NOTE — Patient Instructions (Signed)
Handout on polyps given to patient, await pathology results Resume previous diet and continue present medications Repeat colonoscopy for surveillance will be determined based off of pathology results    YOU HAD AN ENDOSCOPIC PROCEDURE TODAY AT THE [facility]:   Refer to the procedure report that was given to you for any specific questions about what was found during the examination.  If the procedure report does not answer your questions, please call your gastroenterologist to clarify.  If you requested that your care partner not be given the details of your procedure findings, then the procedure report has been included in a sealed envelope for you to review at your convenience later.  YOU SHOULD EXPECT: Some feelings of bloating in the abdomen. Passage of more gas than usual.  Walking can help get rid of the air that was put into your GI tract during the procedure and reduce the bloating. If you had a lower endoscopy (such as a colonoscopy or flexible sigmoidoscopy) you may notice spotting of blood in your stool or on the toilet paper. If you underwent a bowel prep for your procedure, you may not have a normal bowel movement for a few days.  Please Note:  You might notice some irritation and congestion in your nose or some drainage.  This is from the oxygen used during your procedure.  There is no need for concern and it should clear up in a day or so.  SYMPTOMS TO REPORT IMMEDIATELY:  Following lower endoscopy (colonoscopy or flexible sigmoidoscopy):  Excessive amounts of blood in the stool  Significant tenderness or worsening of abdominal pains  Swelling of the abdomen that is new, acute  Fever of 100F or higher  For urgent or emergent issues, a gastroenterologist can be reached at any hour by calling [phone]. Do not use MyChart messaging for urgent concerns.    DIET:  We do recommend a small meal at first, but then you may proceed to your regular diet.  Drink plenty  of fluids but you should avoid alcoholic beverages for 24 hours.  ACTIVITY:  You should plan to take it easy for the rest of today and you should NOT DRIVE or use heavy machinery until tomorrow (because of the sedation medicines used during the test).    FOLLOW UP: Our staff will call the number listed on your records the next business day following your procedure.  We will call around 7:15- 8:00 am to check on you and address any questions or concerns that you may have regarding the information given to you following your procedure. If we do not reach you, we will leave a message.     If any biopsies were taken you will be contacted by phone or by letter within the next 1-3 weeks.  Please call us at [phone] if you have not heard about the biopsies in 3 weeks.    SIGNATURES/CONFIDENTIALITY: You and/or your care partner have signed paperwork which will be entered into your electronic medical record.  These signatures attest to the fact that that the information above on your After Visit Summary has been reviewed and is understood.  Full responsibility of the confidentiality of this discharge information lies with you and/or your care-partner.

## 2022-12-22 NOTE — Progress Notes (Signed)
See office note dated 12/20/2022 for details and current H&P.  Patient presenting for colonoscopy to evaluate change in bowel habits but also family history of colon cancer in the patient's aunt  He remains appropriate for colonoscopy in the [facility] today

## 2022-12-22 NOTE — Progress Notes (Signed)
Called to room to assist during endoscopic procedure.  Patient ID and intended procedure confirmed with present staff. Received instructions for my participation in the procedure from the performing physician.  

## 2022-12-25 ENCOUNTER — Telehealth: Payer: Self-pay

## 2022-12-25 NOTE — Telephone Encounter (Signed)
  Follow up Call-     12/22/2022    2:02 PM  Call back number  Post procedure Call Back phone  # [phone]  Permission to leave phone message Yes     Patient questions:  Do you have a fever, pain , or abdominal swelling? No. Pain Score  0 *  Have you tolerated food without any problems? Yes.    Have you been able to return to your normal activities? Yes.    Do you have any questions about your discharge instructions: Diet   No. Medications  No. Follow up visit  No.  Do you have questions or concerns about your Care? No.  Actions: * If pain score is 4 or above: No action needed, pain <4.

## 2023-01-01 ENCOUNTER — Encounter: Payer: Self-pay | Admitting: Internal Medicine

## 2023-06-08 ENCOUNTER — Other Ambulatory Visit: Payer: Self-pay | Admitting: Family Medicine

## 2023-08-02 ENCOUNTER — Telehealth: Payer: Self-pay | Admitting: *Deleted

## 2023-08-02 DIAGNOSIS — Z131 Encounter for screening for diabetes mellitus: Secondary | ICD-10-CM

## 2023-08-02 DIAGNOSIS — Z1159 Encounter for screening for other viral diseases: Secondary | ICD-10-CM

## 2023-08-02 DIAGNOSIS — Z114 Encounter for screening for human immunodeficiency virus [HIV]: Secondary | ICD-10-CM

## 2023-08-02 DIAGNOSIS — Z13220 Encounter for screening for lipoid disorders: Secondary | ICD-10-CM

## 2023-08-02 DIAGNOSIS — R5383 Other fatigue: Secondary | ICD-10-CM

## 2023-08-02 NOTE — Telephone Encounter (Signed)
-----   Message from Lovena Neighbours sent at 07/31/2023 11:46 AM EST ----- Regarding: Labs for Wednesday 11.27.24 Please put physical lab orders in future. Thank you, Denny Peon

## 2023-08-15 ENCOUNTER — Other Ambulatory Visit (INDEPENDENT_AMBULATORY_CARE_PROVIDER_SITE_OTHER): Payer: BC Managed Care – PPO

## 2023-08-15 DIAGNOSIS — Z114 Encounter for screening for human immunodeficiency virus [HIV]: Secondary | ICD-10-CM

## 2023-08-15 DIAGNOSIS — Z13220 Encounter for screening for lipoid disorders: Secondary | ICD-10-CM | POA: Diagnosis not present

## 2023-08-15 DIAGNOSIS — Z131 Encounter for screening for diabetes mellitus: Secondary | ICD-10-CM

## 2023-08-15 DIAGNOSIS — R5383 Other fatigue: Secondary | ICD-10-CM

## 2023-08-15 DIAGNOSIS — Z1159 Encounter for screening for other viral diseases: Secondary | ICD-10-CM

## 2023-08-15 LAB — CBC WITH DIFFERENTIAL/PLATELET
Basophils Absolute: 0 10*3/uL (ref 0.0–0.1)
Basophils Relative: 0.3 % (ref 0.0–3.0)
Eosinophils Absolute: 0.3 10*3/uL (ref 0.0–0.7)
Eosinophils Relative: 5.2 % — ABNORMAL HIGH (ref 0.0–5.0)
HCT: 46.2 % (ref 39.0–52.0)
Hemoglobin: 15.8 g/dL (ref 13.0–17.0)
Lymphocytes Relative: 43.9 % (ref 12.0–46.0)
Lymphs Abs: 2.5 10*3/uL (ref 0.7–4.0)
MCHC: 34.1 g/dL (ref 30.0–36.0)
MCV: 89.9 fL (ref 78.0–100.0)
Monocytes Absolute: 0.6 10*3/uL (ref 0.1–1.0)
Monocytes Relative: 10 % (ref 3.0–12.0)
Neutro Abs: 2.3 10*3/uL (ref 1.4–7.7)
Neutrophils Relative %: 40.6 % — ABNORMAL LOW (ref 43.0–77.0)
Platelets: 246 10*3/uL (ref 150.0–400.0)
RBC: 5.14 Mil/uL (ref 4.22–5.81)
RDW: 12.9 % (ref 11.5–15.5)
WBC: 5.7 10*3/uL (ref 4.0–10.5)

## 2023-08-15 LAB — BASIC METABOLIC PANEL
BUN: 16 mg/dL (ref 6–23)
CO2: 26 meq/L (ref 19–32)
Calcium: 9.5 mg/dL (ref 8.4–10.5)
Chloride: 105 meq/L (ref 96–112)
Creatinine, Ser: 0.96 mg/dL (ref 0.40–1.50)
GFR: 100.06 mL/min (ref >60.00)
Glucose, Bld: 96 mg/dL (ref 70–99)
Potassium: 4.7 meq/L (ref 3.5–5.1)
Sodium: 139 meq/L (ref 135–145)

## 2023-08-15 LAB — LIPID PANEL
Cholesterol: 267 mg/dL — ABNORMAL HIGH (ref 0–200)
HDL: 36.5 mg/dL — ABNORMAL LOW (ref >39.00)
Total CHOL/HDL Ratio: 7
Triglycerides: 658 mg/dL — ABNORMAL HIGH (ref 0.0–149.0)

## 2023-08-15 LAB — HEPATIC FUNCTION PANEL
ALT: 20 U/L (ref 0–53)
AST: 18 U/L (ref 0–37)
Albumin: 4.7 g/dL (ref 3.5–5.2)
Alkaline Phosphatase: 78 U/L (ref 39–117)
Bilirubin, Direct: 0.1 mg/dL (ref 0.0–0.3)
Total Bilirubin: 0.4 mg/dL (ref 0.2–1.2)
Total Protein: 6.7 g/dL (ref 6.0–8.3)

## 2023-08-15 LAB — LDL CHOLESTEROL, DIRECT: Direct LDL: 95 mg/dL

## 2023-08-15 LAB — HEMOGLOBIN A1C: Hgb A1c MFr Bld: 5.4 % (ref 4.6–6.5)

## 2023-08-17 LAB — HIV ANTIBODY (ROUTINE TESTING W REFLEX): HIV 1&2 Ab, 4th Generation: NONREACTIVE

## 2023-08-17 LAB — HEPATITIS C ANTIBODY: Hepatitis C Ab: NONREACTIVE

## 2023-08-21 ENCOUNTER — Encounter: Payer: Self-pay | Admitting: Family Medicine

## 2023-08-21 DIAGNOSIS — F411 Generalized anxiety disorder: Secondary | ICD-10-CM | POA: Insufficient documentation

## 2023-08-21 HISTORY — DX: Generalized anxiety disorder: F41.1

## 2023-08-21 NOTE — Progress Notes (Unsigned)
Juliette Standre T. Lucero Auzenne, MD, CAQ Sports Medicine [facility] at [facility] [address] [place], [address]  Phone: [phone]  FAX: [phone]  Tyler Simon - 37 y.o. male  MRN [id number]  Date of Birth: 1986/04/13  Date: 08/22/2023  PCP: Hannah Beat, MD  Referral: Hannah Beat, MD  No chief complaint on file.  Patient Care Team: Hannah Beat, MD as PCP - General (Family Medicine) Subjective:   Tyler Simon is a 37 y.o. pleasant patient who presents with the following:  Preventative Health Maintenance Visit:  Health Maintenance Summary Reviewed and updated, unless pt declines services.  Tobacco History Reviewed. Alcohol: No concerns, no excessive use Exercise Habits: Some activity, rec at least 30 mins 5 times a week STD concerns: no risk or activity to increase risk Drug Use: None  Flu Covid   Health Maintenance  Topic Date Due   INFLUENZA VACCINE  Never done   COVID-19 Vaccine (1 - 2023-24 season) Never done   Colonoscopy  12/21/2025   DTaP/Tdap/Td (2 - Td or Tdap) 12/27/2027   Hepatitis C Screening  Completed   HIV Screening  Completed   HPV VACCINES  Aged Out   Immunization History  Administered Date(s) Administered   Tdap 12/26/2017   Patient Active Problem List   Diagnosis Date Noted   Generalized anxiety disorder 08/21/2023   OSA (obstructive sleep apnea) 11/23/2017   Vas deferens stricture 10/17/2013   Allergic rhinitis due to pollen 07/25/2013   Pipe smoker 07/25/2013    Past Medical History:  Diagnosis Date   Allergic rhinitis due to pollen 07/25/2013   External hemorrhoid    Generalized anxiety disorder 08/21/2023   Internal hemorrhoids    Pipe smoker 07/25/2013   Sleep apnea     Past Surgical History:  Procedure Laterality Date   APPENDECTOMY  1988   INGUINAL HERNIA REPAIR Right 10/09/2013   Procedure: Right Groin Exploration;  Surgeon: [name]. 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  Review of Systems: Pertinent positives are listed above.  Otherwise, a full 14 point review of systems has been done in full and it is negative except where it is noted positive.  Objective:   There were no vitals taken for this visit. Ideal Body Weight:    Ideal Body Weight:   No results found.    08/14/2022    8:43 AM 05/16/2021   12:38 PM 09/29/2019    3:35 PM  Depression screen PHQ 2/9  Decreased Interest 0 1 0  Down, Depressed, Hopeless 0 1 0  PHQ - 2 Score 0 2 0  Altered sleeping  2   Tired, decreased energy  3   Change in appetite  0   Feeling bad or failure about yourself   1   Trouble concentrating  1   Moving slowly or fidgety/restless  1   Suicidal thoughts  0   PHQ-9 Score  10   Difficult doing  work/chores  Somewhat difficult      GEN: well developed, well nourished, no acute distress Eyes: conjunctiva and lids normal, PERRLA, EOMI ENT: TM clear, nares clear, oral exam WNL Neck: supple, no lymphadenopathy, no thyromegaly, no JVD Pulm: clear to auscultation and percussion, respiratory effort normal CV: regular rate and rhythm, S1-S2, no murmur, rub or gallop, no bruits, peripheral pulses normal and symmetric, no cyanosis, clubbing, edema or varicosities GI: soft, non-tender; no hepatosplenomegaly, masses; active bowel sounds all quadrants GU: deferred Lymph: no cervical, axillary or inguinal adenopathy MSK: gait normal, muscle tone and strength WNL, no joint swelling, effusions,  discoloration, crepitus  SKIN: clear, good turgor, color WNL, no rashes, lesions, or ulcerations Neuro: normal mental status, normal strength, sensation, and motion Psych: alert; oriented to person, place and time, normally interactive and not anxious or depressed in appearance.  All labs reviewed with patient. Results for orders placed or performed in visit on 08/15/23  HIV Antibody (routine testing w rflx)  Result Value Ref Range   HIV 1&2 Ab, 4th Generation NON-REACTIVE NON-REACTIVE  Hepatic Function Panel  Result Value Ref Range   Total Bilirubin 0.4 0.2 - 1.2 mg/dL   Bilirubin, Direct 0.1 0.0 - 0.3 mg/dL   Alkaline Phosphatase 78 39 - 117 U/L   AST 18 0 - 37 U/L   ALT 20 0 - 53 U/L   Total Protein 6.7 6.0 - 8.3 g/dL   Albumin 4.7 3.5 - 5.2 g/dL  Basic metabolic panel  Result Value Ref Range   Sodium 139 135 - 145 mEq/L   Potassium 4.7 3.5 - 5.1 mEq/L   Chloride 105 96 - 112 mEq/L   CO2 26 19 - 32 mEq/L   Glucose, Bld 96 70 - 99 mg/dL   BUN 16 6 - 23 mg/dL   Creatinine, Ser 1.61 0.40 - 1.50 mg/dL   GFR 096.04 >54.09 mL/min   Calcium 9.5 8.4 - 10.5 mg/dL  Hemoglobin W1X  Result Value Ref Range   Hgb A1c MFr Bld 5.4 4.6 - 6.5 %  CBC with Differential/Platelet  Result Value Ref Range   WBC 5.7 4.0 - 10.5 K/uL   RBC 5.14 4.22 - 5.81 Mil/uL   Hemoglobin 15.8 13.0 - 17.0 g/dL   HCT 91.4 78.2 - 95.6 %   MCV 89.9 78.0 - 100.0 fl   MCHC 34.1 30.0 - 36.0 g/dL   RDW 21.3 08.6 - 57.8 %   Platelets 246.0 150.0 - 400.0 K/uL   Neutrophils Relative % 40.6 (L) 43.0 - 77.0 %   Lymphocytes Relative 43.9 12.0 - 46.0 %   Monocytes Relative 10.0 3.0 - 12.0 %   Eosinophils Relative 5.2 (H) 0.0 - 5.0 %   Basophils Relative 0.3 0.0 - 3.0 %   Neutro Abs 2.3 1.4 - 7.7 K/uL   Lymphs Abs 2.5 0.7 - 4.0 K/uL   Monocytes Absolute 0.6 0.1 - 1.0 K/uL   Eosinophils Absolute 0.3 0.0 - 0.7 K/uL   Basophils Absolute 0.0 0.0 - 0.1 K/uL  Hepatitis C antibody  Result Value Ref Range   Hepatitis C  Ab NON-REACTIVE NON-REACTIVE  Lipid panel  Result Value Ref Range   Cholesterol 267 (H) 0 - 200 mg/dL   Triglycerides (H) 0.0 - 149.0 mg/dL    469.6 Triglyceride is over 400; calculations on Lipids are invalid.   HDL 36.50 (L) >39.00 mg/dL   Total CHOL/HDL Ratio 7   LDL cholesterol, direct  Result Value Ref Range   Direct LDL  95.0 mg/dL    Assessment and Plan:     ICD-10-CM   1. Healthcare maintenance  Z00.00       Health Maintenance Exam: The patient's preventative maintenance and recommended screening tests for an annual wellness exam were reviewed in full today. Brought up to date unless services declined.  Counselled on the importance of diet, exercise, and its role in overall health and mortality. The patient's FH and SH was reviewed, including their home life, tobacco status, and drug and alcohol status.  Follow-up in 1 year for physical exam or additional follow-up below.  Disposition: No follow-ups on file.  No orders of the defined types were placed in this encounter.  There are no discontinued medications. No orders of the defined types were placed in this encounter.   Signed,  Elpidio Galea. Lorrayne Ismael, MD   Allergies as of 08/22/2023   No Known Allergies      Medication List        Accurate as of August 21, 2023  5:13 PM. If you have any questions, ask your nurse or doctor.          ALOE 78469 & PROBIOTICS PO Take by mouth.   FLUoxetine 20 MG capsule Commonly known as: PROZAC TAKE 1 CAPSULE(20 MG) BY MOUTH DAILY

## 2023-08-22 ENCOUNTER — Ambulatory Visit (INDEPENDENT_AMBULATORY_CARE_PROVIDER_SITE_OTHER): Payer: BC Managed Care – PPO | Admitting: Family Medicine

## 2023-08-22 ENCOUNTER — Encounter: Payer: Self-pay | Admitting: Family Medicine

## 2023-08-22 VITALS — BP 100/70 | HR 65 | Temp 98.1°F | Ht 68.75 in | Wt 167.2 lb

## 2023-08-22 DIAGNOSIS — E782 Mixed hyperlipidemia: Secondary | ICD-10-CM | POA: Diagnosis not present

## 2023-08-22 DIAGNOSIS — Z0000 Encounter for general adult medical examination without abnormal findings: Secondary | ICD-10-CM

## 2023-08-23 ENCOUNTER — Encounter: Payer: Self-pay | Admitting: Family Medicine

## 2023-09-18 ENCOUNTER — Other Ambulatory Visit: Payer: Self-pay | Admitting: Family Medicine

## 2023-11-20 ENCOUNTER — Other Ambulatory Visit: Payer: BC Managed Care – PPO

## 2023-12-03 ENCOUNTER — Encounter: Payer: Self-pay | Admitting: Family Medicine

## 2023-12-03 ENCOUNTER — Other Ambulatory Visit (INDEPENDENT_AMBULATORY_CARE_PROVIDER_SITE_OTHER)

## 2023-12-03 DIAGNOSIS — E782 Mixed hyperlipidemia: Secondary | ICD-10-CM

## 2023-12-03 LAB — LIPID PANEL
Cholesterol: 186 mg/dL (ref 0–200)
HDL: 36.2 mg/dL — ABNORMAL LOW (ref >39.00)
LDL Cholesterol: 106 mg/dL — ABNORMAL HIGH (ref 0–99)
NonHDL: 149.64
Total CHOL/HDL Ratio: 5
Triglycerides: 216 mg/dL — ABNORMAL HIGH (ref 0.0–149.0)
VLDL: 43.2 mg/dL — ABNORMAL HIGH (ref 0.0–40.0)

## 2024-01-31 ENCOUNTER — Other Ambulatory Visit: Payer: Self-pay | Admitting: Family Medicine

## 2024-04-01 DIAGNOSIS — L821 Other seborrheic keratosis: Secondary | ICD-10-CM | POA: Diagnosis not present

## 2024-04-01 DIAGNOSIS — D485 Neoplasm of uncertain behavior of skin: Secondary | ICD-10-CM | POA: Diagnosis not present

## 2024-04-01 DIAGNOSIS — B360 Pityriasis versicolor: Secondary | ICD-10-CM | POA: Diagnosis not present

## 2024-04-07 ENCOUNTER — Other Ambulatory Visit: Payer: Self-pay | Admitting: Family Medicine

## 2024-04-27 NOTE — Progress Notes (Signed)
     Tyler Jeremiah T. Alphonsine Minium, MD, CAQ Sports Medicine [facility] at [facility] [address] [place], [address]  Phone: [phone]  FAX: [phone]  Tyler Simon - 38 y.o. male  MRN [id number]  Date of Birth: February 23, 1986  Date: 04/28/2024  PCP: Watt Mirza, MD  Referral: Watt Mirza, MD  No chief complaint on file.  Subjective:   Tyler Simon is a 38 y.o. very pleasant male patient with There is no height or weight on file to calculate BMI. who presents with the following:  He has concerns regarding his xiphoid process.    Review of Systems is noted in the HPI, as appropriate  Objective:   There were no vitals taken for this visit.  GEN: No acute distress; alert,appropriate. PULM: Breathing comfortably in no respiratory distress PSYCH: Normally interactive.   Laboratory and Imaging Data:  Assessment and Plan:   ***

## 2024-04-28 ENCOUNTER — Ambulatory Visit (INDEPENDENT_AMBULATORY_CARE_PROVIDER_SITE_OTHER): Admitting: Family Medicine

## 2024-04-28 ENCOUNTER — Encounter: Payer: Self-pay | Admitting: Family Medicine

## 2024-04-28 VITALS — BP 98/60 | HR 47 | Temp 98.0°F | Ht 68.75 in | Wt 158.1 lb

## 2024-04-28 DIAGNOSIS — Z0000 Encounter for general adult medical examination without abnormal findings: Secondary | ICD-10-CM

## 2024-04-28 DIAGNOSIS — R0789 Other chest pain: Secondary | ICD-10-CM

## 2024-05-20 ENCOUNTER — Ambulatory Visit: Payer: Self-pay | Admitting: *Deleted

## 2024-05-20 ENCOUNTER — Encounter: Payer: Self-pay | Admitting: Family Medicine

## 2024-05-20 NOTE — Telephone Encounter (Signed)
 Copied from CRM #[id number]. Topic: Clinical - Red Word Triage >> May 20, 2024 11:22 AM Macario HERO wrote: Red Word that prompted transfer to Nurse Triage: Patient called and said he got a splinter from wood recently and would like to speak with a nurse.

## 2024-05-20 NOTE — Telephone Encounter (Signed)
 Tyler Simon also sent a [employer].  He just wanted to make sure he was up to date on his tetanus shot.  I messaged him back with date of his last Tdap 12/26/2017.SABRA  Nothing further was needed.

## 2024-05-20 NOTE — Telephone Encounter (Signed)
 Attempted to call pt back. Line either disconnected before Patient Access Specialist could get us  connected or he hung up.   Left a voicemail to call back.  Routed to call back basket.

## 2024-05-20 NOTE — Telephone Encounter (Signed)
 2nd attempt; no answer. Left voicemail for patient to return call for nurse triage.    Copied from CRM #[id number]. Topic: Clinical - Red Word Triage >> May 20, 2024 11:22 AM Macario HERO wrote: Red Word that prompted transfer to Nurse Triage: Patient called and said he got a splinter from wood recently and would like to speak with a nurse.

## 2024-05-20 NOTE — Telephone Encounter (Signed)
 This RN attempted to contact patient (3rd attempt). No answer. Will forward to office for follow up.

## 2024-09-29 ENCOUNTER — Other Ambulatory Visit: Payer: Self-pay | Admitting: Family Medicine

## 2024-09-29 DIAGNOSIS — Z131 Encounter for screening for diabetes mellitus: Secondary | ICD-10-CM

## 2024-09-29 DIAGNOSIS — Z1159 Encounter for screening for other viral diseases: Secondary | ICD-10-CM

## 2024-09-29 DIAGNOSIS — R5383 Other fatigue: Secondary | ICD-10-CM

## 2024-09-29 DIAGNOSIS — Z13220 Encounter for screening for lipoid disorders: Secondary | ICD-10-CM

## 2024-09-29 NOTE — Telephone Encounter (Signed)
Please call and schedule CPE with fasting labs prior with Dr. Copland.  

## 2024-09-30 NOTE — Telephone Encounter (Signed)
 I called and LVM.

## 2024-10-06 NOTE — Progress Notes (Unsigned)
 "    Tyler Ilagan T. Jodell Weitman, MD, CAQ Sports Medicine [facility] at [facility] [address] [place], [address]  Phone: [phone]  FAX: [phone]  Tyler Simon - 39 y.o. male  MRN [id number]  Date of Birth: Mar 29, 1986  Date: 10/08/2024  PCP: Tyler Mirza, MD  Referral: Tyler Mirza, MD  No chief complaint on file.  Patient Care Team: Tyler Mirza, MD as PCP - General (Family Medicine) Subjective:   Tyler Simon is a 39 y.o. pleasant patient who presents with the following:  Discussed the use of AI scribe software for clinical note transcription with the patient, who gave verbal consent to proceed.  History of Present Illness     Preventative Health Maintenance Visit:  Health Maintenance Summary Reviewed and updated, unless pt declines services.  Tobacco History Reviewed. Alcohol: No concerns, no excessive use Exercise Habits: Some activity, rec at least 30 mins 5 times a week STD concerns: no risk or activity to increase risk Drug Use: None  COVID Flu vaccine  He has had intermittent anxiety since I have known him, and he is doing well on Prozac  20 mg a day.  Health Maintenance  Topic Date Due   Pneumococcal Vaccine (1 of 2 - PCV) Never done   Hepatitis B Vaccines 19-59 Average Risk (1 of 3 - 19+ 3-dose series) Never done   COVID-19 Vaccine (1 - 2025-26 season) Never done   Influenza Vaccine  12/16/2024 (Originally 04/18/2024)   Colonoscopy  12/21/2025   DTaP/Tdap/Td (2 - Td or Tdap) 12/27/2027   HPV VACCINES (No Doses Required) Completed   Hepatitis C Screening  Completed   HIV Screening  Completed   Meningococcal B Vaccine  Aged Out   Immunization History  Administered Date(s) Administered   Tdap 12/26/2017   Patient Active Problem List   Diagnosis Date Noted   Generalized anxiety disorder 08/21/2023    Priority: Medium    OSA (obstructive sleep apnea) 11/23/2017   Vas deferens stricture 10/17/2013   Allergic  rhinitis due to pollen 07/25/2013   Pipe smoker 07/25/2013    Past Medical History:  Diagnosis Date   Allergic rhinitis due to pollen 07/25/2013   External hemorrhoid    Generalized anxiety disorder 08/21/2023   Internal hemorrhoids    Pipe smoker 07/25/2013   Sleep apnea     Past Surgical History:  Procedure Laterality Date   APPENDECTOMY  1988   INGUINAL HERNIA REPAIR Right 10/09/2013   Procedure: Right Groin Exploration;  Surgeon: Donnice POUR. 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  Review of Systems: Pertinent positives are listed above.  Otherwise, a full 14 point review of systems has been done in full and it is negative except where it is noted positive.  Objective:   There were no vitals taken for this visit. Ideal Body Weight:    Ideal Body Weight:   No results found.    08/22/2023    3:46 PM  08/14/2022    8:43 AM 05/16/2021   12:38 PM 09/29/2019    3:35 PM  Depression screen PHQ 2/9  Decreased Interest 0 0 1 0  Down, Depressed, Hopeless 0 0 1 0  PHQ - 2 Score 0 0 2 0  Altered sleeping   2   Tired, decreased energy   3   Change in appetite   0   Feeling bad or failure about yourself    1   Trouble concentrating   1   Moving slowly or fidgety/restless   1   Suicidal thoughts   0   PHQ-9 Score   10    Difficult doing work/chores   Somewhat difficult      Data saved with a previous flowsheet row definition     GEN: well developed, well nourished, no acute distress Eyes:  conjunctiva and lids normal, PERRLA, EOMI ENT: TM clear, nares clear, oral exam WNL Neck: supple, no lymphadenopathy, no thyromegaly, no JVD Pulm: clear to auscultation and percussion, respiratory effort normal CV: regular rate and rhythm, S1-S2, no murmur, rub or gallop, no bruits, peripheral pulses normal and symmetric, no cyanosis, clubbing, edema or varicosities GI: soft, non-tender; no hepatosplenomegaly, masses; active bowel sounds all quadrants GU: deferred Lymph: no cervical, axillary or inguinal adenopathy MSK: gait normal, muscle tone and strength WNL, no joint swelling, effusions, discoloration, crepitus  SKIN: clear, good turgor, color WNL, no rashes, lesions, or ulcerations Neuro: normal mental status, normal strength, sensation, and motion Psych: alert; oriented to person, place and time, normally interactive and not anxious or depressed in appearance.  All labs reviewed with patient. Results for orders placed or performed in visit on 12/03/23  Lipid panel   Collection Time: 12/03/23  8:42 AM  Result Value Ref Range   Cholesterol 186 0 - 200 mg/dL   Triglycerides 783.9 (H) 0.0 - 149.0 mg/dL   HDL 63.79 (L) >60.99 mg/dL   VLDL 56.7 (H) 0.0 - 59.9 mg/dL   LDL Cholesterol 893 (H) 0 - 99 mg/dL   Total CHOL/HDL Ratio 5    NonHDL 149.64     Assessment and Plan:     ICD-10-CM   1. Healthcare maintenance  Z00.00      Assessment & Plan   Health Maintenance Exam: The patient's preventative maintenance and recommended screening tests for an annual wellness exam were reviewed in full today. Brought up to date unless services declined.  Counselled on the importance of diet, exercise, and its role in overall health and mortality. The patient's FH and SH was reviewed, including their home life, tobacco status, and drug and alcohol status.  Follow-up in 1 year for physical exam or additional follow-up below.  Disposition: No follow-ups on file.  No orders of the defined  types were placed in this encounter.  There are no discontinued medications. No orders of the defined types were placed in this encounter.   Signed,  Tyler Simon. Tyler Sayegh, MD   Allergies as of 10/08/2024   No Known Allergies      Medication List        Accurate as of October 06, 2024  1:12 PM. If you have any questions, ask your nurse or doctor.          FLUoxetine  20 MG capsule Commonly known as: PROZAC  TAKE 1 CAPSULE(20 MG) BY MOUTH DAILY   ketoconazole 2 % cream Commonly known as: NIZORAL Apply 1 Application topically 2 (two) times daily as needed.       "

## 2024-10-08 ENCOUNTER — Encounter: Admitting: Family Medicine

## 2024-10-08 ENCOUNTER — Encounter: Payer: Self-pay | Admitting: Family Medicine

## 2024-10-08 ENCOUNTER — Ambulatory Visit (INDEPENDENT_AMBULATORY_CARE_PROVIDER_SITE_OTHER): Admitting: Family Medicine

## 2024-10-08 VITALS — BP 120/80 | HR 61 | Temp 98.2°F | Ht 68.75 in | Wt 160.4 lb

## 2024-10-08 DIAGNOSIS — Z131 Encounter for screening for diabetes mellitus: Secondary | ICD-10-CM

## 2024-10-08 DIAGNOSIS — Z0000 Encounter for general adult medical examination without abnormal findings: Secondary | ICD-10-CM | POA: Diagnosis not present

## 2024-10-08 DIAGNOSIS — Z13220 Encounter for screening for lipoid disorders: Secondary | ICD-10-CM | POA: Diagnosis not present

## 2024-10-08 DIAGNOSIS — R5383 Other fatigue: Secondary | ICD-10-CM | POA: Diagnosis not present

## 2024-10-08 LAB — HEPATIC FUNCTION PANEL
ALT: 20 U/L (ref 3–53)
AST: 25 U/L (ref 5–37)
Albumin: 4.7 g/dL (ref 3.5–5.2)
Alkaline Phosphatase: 62 U/L (ref 39–117)
Bilirubin, Direct: 0.1 mg/dL (ref 0.1–0.3)
Total Bilirubin: 0.5 mg/dL (ref 0.2–1.2)
Total Protein: 7.1 g/dL (ref 6.0–8.3)

## 2024-10-08 LAB — CBC WITH DIFFERENTIAL/PLATELET
Basophils Absolute: 0 K/uL (ref 0.0–0.1)
Basophils Relative: 0.6 % (ref 0.0–3.0)
Eosinophils Absolute: 0.3 K/uL (ref 0.0–0.7)
Eosinophils Relative: 4.3 % (ref 0.0–5.0)
HCT: 46 % (ref 39.0–52.0)
Hemoglobin: 15.5 g/dL (ref 13.0–17.0)
Lymphocytes Relative: 31.1 % (ref 12.0–46.0)
Lymphs Abs: 1.9 K/uL (ref 0.7–4.0)
MCHC: 33.7 g/dL (ref 30.0–36.0)
MCV: 90.3 fl (ref 78.0–100.0)
Monocytes Absolute: 0.6 K/uL (ref 0.1–1.0)
Monocytes Relative: 10.4 % (ref 3.0–12.0)
Neutro Abs: 3.3 K/uL (ref 1.4–7.7)
Neutrophils Relative %: 53.6 % (ref 43.0–77.0)
Platelets: 242 K/uL (ref 150.0–400.0)
RBC: 5.1 Mil/uL (ref 4.22–5.81)
RDW: 13.5 % (ref 11.5–15.5)
WBC: 6.2 K/uL (ref 4.0–10.5)

## 2024-10-08 LAB — LIPID PANEL
Cholesterol: 221 mg/dL — ABNORMAL HIGH (ref 28–200)
HDL: 58.8 mg/dL
LDL Cholesterol: 132 mg/dL — ABNORMAL HIGH (ref 10–99)
NonHDL: 162.25
Total CHOL/HDL Ratio: 4
Triglycerides: 153 mg/dL — ABNORMAL HIGH (ref 10.0–149.0)
VLDL: 30.6 mg/dL (ref 0.0–40.0)

## 2024-10-08 LAB — BASIC METABOLIC PANEL WITH GFR
BUN: 16 mg/dL (ref 6–23)
CO2: 29 meq/L (ref 19–32)
Calcium: 9.4 mg/dL (ref 8.4–10.5)
Chloride: 105 meq/L (ref 96–112)
Creatinine, Ser: 1 mg/dL (ref 0.40–1.50)
GFR: 94.51 mL/min
Glucose, Bld: 87 mg/dL (ref 70–99)
Potassium: 4.2 meq/L (ref 3.5–5.1)
Sodium: 140 meq/L (ref 135–145)

## 2024-10-08 LAB — HEMOGLOBIN A1C: Hgb A1c MFr Bld: 5.3 % (ref 4.6–6.5)

## 2024-10-08 MED ORDER — FLUOXETINE HCL 10 MG PO CAPS: 10.0000 mg | ORAL_CAPSULE | Freq: Every day | ORAL | 1 refills | Status: AC

## 2024-10-10 ENCOUNTER — Ambulatory Visit: Payer: Self-pay | Admitting: Family Medicine
